# Patient Record
Sex: Female | Born: 1965
Health system: Southern US, Community
[De-identification: ages and names within clinical notes are randomized; demographics above are authoritative.]

## PROBLEM LIST (undated history)

## (undated) DIAGNOSIS — J302 Other seasonal allergic rhinitis: Secondary | ICD-10-CM

## (undated) DIAGNOSIS — T7840XA Allergy, unspecified, initial encounter: Secondary | ICD-10-CM

## (undated) DIAGNOSIS — Z9889 Other specified postprocedural states: Secondary | ICD-10-CM

## (undated) DIAGNOSIS — R112 Nausea with vomiting, unspecified: Secondary | ICD-10-CM

## (undated) HISTORY — DX: Nausea with vomiting, unspecified: R11.2

## (undated) HISTORY — PX: TOTAL ANKLE ARTHROPLASTY: SHX811

## (undated) HISTORY — PX: WISDOM TOOTH EXTRACTION: SHX21

## (undated) HISTORY — DX: Other seasonal allergic rhinitis: J30.2

## (undated) HISTORY — DX: Allergy, unspecified, initial encounter: T78.40XA

## (undated) HISTORY — DX: Nausea with vomiting, unspecified: Z98.890

## (undated) HISTORY — PX: COLONOSCOPY: SHX174

## (undated) HISTORY — PX: POLYPECTOMY: SHX149

---

## 2006-04-24 ENCOUNTER — Ambulatory Visit: Payer: Self-pay | Admitting: Family Medicine

## 2007-04-10 ENCOUNTER — Ambulatory Visit: Payer: Self-pay | Admitting: Family Medicine

## 2007-04-10 DIAGNOSIS — M25579 Pain in unspecified ankle and joints of unspecified foot: Secondary | ICD-10-CM

## 2007-05-14 ENCOUNTER — Encounter (INDEPENDENT_AMBULATORY_CARE_PROVIDER_SITE_OTHER): Payer: Self-pay | Admitting: Internal Medicine

## 2007-05-25 ENCOUNTER — Telehealth (INDEPENDENT_AMBULATORY_CARE_PROVIDER_SITE_OTHER): Payer: Self-pay | Admitting: Internal Medicine

## 2007-06-04 ENCOUNTER — Encounter (INDEPENDENT_AMBULATORY_CARE_PROVIDER_SITE_OTHER): Payer: Self-pay | Admitting: Internal Medicine

## 2007-06-10 ENCOUNTER — Ambulatory Visit: Payer: Self-pay | Admitting: Physician Assistant

## 2007-07-21 ENCOUNTER — Ambulatory Visit: Payer: Self-pay | Admitting: Family Medicine

## 2007-07-21 ENCOUNTER — Encounter (INDEPENDENT_AMBULATORY_CARE_PROVIDER_SITE_OTHER): Payer: Self-pay | Admitting: Internal Medicine

## 2007-07-23 ENCOUNTER — Encounter (INDEPENDENT_AMBULATORY_CARE_PROVIDER_SITE_OTHER): Payer: Self-pay | Admitting: Internal Medicine

## 2007-07-23 LAB — CONVERTED CEMR LAB
ALT: 16 units/L (ref 0–35)
AST: 21 units/L (ref 0–37)
Alkaline Phosphatase: 66 units/L (ref 39–117)
BUN: 7 mg/dL (ref 6–23)
Basophils Relative: 0 % (ref 0.0–1.0)
Bilirubin, Direct: 0.1 mg/dL (ref 0.0–0.3)
CO2: 29 meq/L (ref 19–32)
Calcium: 9.4 mg/dL (ref 8.4–10.5)
Chloride: 103 meq/L (ref 96–112)
Creatinine, Ser: 0.7 mg/dL (ref 0.4–1.2)
Eosinophils Relative: 0.9 % (ref 0.0–5.0)
Glucose, Bld: 92 mg/dL (ref 70–99)
Lymphocytes Relative: 23.5 % (ref 12.0–46.0)
Monocytes Relative: 2.4 % — ABNORMAL LOW (ref 3.0–11.0)
Neutro Abs: 5.1 10*3/uL (ref 1.4–7.7)
Platelets: 257 10*3/uL (ref 150–400)
RDW: 11.6 % (ref 11.5–14.6)
Total Bilirubin: 0.8 mg/dL (ref 0.3–1.2)
Total Protein: 7.7 g/dL (ref 6.0–8.3)
WBC: 7.1 10*3/uL (ref 4.5–10.5)

## 2007-07-31 ENCOUNTER — Ambulatory Visit: Payer: Self-pay | Admitting: Podiatry

## 2008-05-13 ENCOUNTER — Ambulatory Visit: Payer: Self-pay | Admitting: Family Medicine

## 2008-05-13 DIAGNOSIS — J309 Allergic rhinitis, unspecified: Secondary | ICD-10-CM

## 2008-12-21 ENCOUNTER — Telehealth: Payer: Self-pay | Admitting: Family Medicine

## 2009-05-08 ENCOUNTER — Ambulatory Visit: Payer: Self-pay | Admitting: Family Medicine

## 2009-06-15 ENCOUNTER — Ambulatory Visit: Payer: Self-pay | Admitting: Family Medicine

## 2010-10-11 ENCOUNTER — Other Ambulatory Visit: Payer: Self-pay | Admitting: Family Medicine

## 2010-10-11 ENCOUNTER — Ambulatory Visit (INDEPENDENT_AMBULATORY_CARE_PROVIDER_SITE_OTHER): Payer: BC Managed Care – PPO | Admitting: Family Medicine

## 2010-10-11 ENCOUNTER — Encounter: Payer: Self-pay | Admitting: Family Medicine

## 2010-10-11 DIAGNOSIS — M25569 Pain in unspecified knee: Secondary | ICD-10-CM | POA: Insufficient documentation

## 2010-10-11 DIAGNOSIS — Z136 Encounter for screening for cardiovascular disorders: Secondary | ICD-10-CM

## 2010-10-11 DIAGNOSIS — Z0000 Encounter for general adult medical examination without abnormal findings: Secondary | ICD-10-CM

## 2010-10-12 LAB — BASIC METABOLIC PANEL
BUN: 15 mg/dL (ref 6–23)
CO2: 30 mEq/L (ref 19–32)
Calcium: 9.3 mg/dL (ref 8.4–10.5)
Chloride: 101 mEq/L (ref 96–112)
Creatinine, Ser: 1 mg/dL (ref 0.4–1.2)
GFR: 60.44 mL/min (ref >60.00)
Glucose, Bld: 89 mg/dL (ref 70–99)
Potassium: 4.9 mEq/L (ref 3.5–5.1)
Sodium: 138 mEq/L (ref 135–145)

## 2010-10-12 LAB — LIPID PANEL
Cholesterol: 188 mg/dL (ref 0–200)
HDL: 85.3 mg/dL (ref >39.00)
LDL Cholesterol: 82 mg/dL (ref 0–99)
Total CHOL/HDL Ratio: 2
Triglycerides: 106 mg/dL (ref 0.0–149.0)
VLDL: 21.2 mg/dL (ref 0.0–40.0)

## 2010-10-18 NOTE — Assessment & Plan Note (Signed)
Summary: transfer from [facility], renew meds/alc   Vital Signs:  Patient profile:   45 year old female Height:      63.5 inches Weight:      177.50 pounds BMI:     31.06 Temp:     98.7 degrees F oral Pulse rate:   68 / minute Pulse rhythm:   regular BP sitting:   110 / 72  (left arm) Cuff size:   large  Vitals Entered By: Linde Gillis CMA [facility]) (October 11, 2010 2:47 PM) CC: transfer from [facility]   History of Present Illness: 45 yo healthy female here to establish care from [facility].  has had some intermittent right knee pain since her ankle surgery in 2008. pain typically starts at end of day or after working in yard. not in front of knee, some aching behind the knee. today, not having any symptoms.  allergic rhinitis- stable on Clarinex and flonase.  Uses proventil only when she has a bad cold.  no h/o asthma.  well woman- due for pap smear (goes to [facility]).  No h/o abnormal pap smears has never had a mammogram. due for FLP, BMET as well.  Current Medications (verified): 1)  Clarinex-D 24 Hour 5-240 Mg Xr24h-Tab (Desloratadine-Pseudoephedrine) .Marland Kitchen.. 1 Once Daily For Allergic Rhinitis As Needed 2)  Flonase 50 Mcg/act Susp (Fluticasone Propionate) .... 2 Sprays in Each Notril Daily As Needed. 3)  Proventil Hfa 108 (90 Base) Mcg/act Aers (Albuterol Sulfate) .... 2 Puffs Every 4 Hrs As Needed Chest Tightness or Wheezing  Allergies (verified): No Known Drug Allergies  Past History:  Past Surgical History: Last updated: 07/21/2007 wisdom teeth without anesthesia  Family History: Last updated: 07/21/2007 Father: living--HBP, elevated lipids Mother: living--well Siblings: 2 br--L&W               4 sis--1 with cardiovascular syndrome(genetic), depression                         3 --L&W  DM-MGM, nephew MI-0 CVA- 0 Prostate Cancer-0 Breast Cancer-0 Ovarian Cancer-0 Uterine Cancer-0 Colon Cancer-0 Drug/ ETOH Abuse-0 Depression- 0  Social History: Last  updated: 07/21/2007 Marital Status: single Children: 0 Occupation:   Risk Factors: Smoking Status: never (07/21/2007)  Review of Systems      See HPI General:  Denies malaise. Eyes:  Denies blurring. CV:  Denies chest pain or discomfort. Resp:  Denies shortness of breath. GI:  Denies abdominal pain and change in bowel habits. GU:  Denies abnormal vaginal bleeding and dysuria. Derm:  Denies rash. Neuro:  Denies headaches. Psych:  Denies anxiety and depression. Endo:  Denies cold intolerance and heat intolerance.  Physical Exam  General:  alert, well-developed, well-nourished, and well-hydrated.   Head:  normocephalic and atraumatic.   Eyes:  pupils equal, pupils round, and no injection.   Ears:  R ear normal and L ear normal.   Nose:  External nasal examination shows no deformity or inflammation. Nasal mucosa are pink and moist without lesions or exudates. Mouth:  Oral mucosa and oropharynx without lesions or exudates.  Teeth in good repair. Lungs:  normal respiratory effort, no intercostal retractions, no accessory muscle use, normal breath sounds, no crackles, and no wheezes.   Heart:  normal rate, regular rhythm, and no murmur.   Abdomen:  Bowel sounds positive,abdomen soft and non-tender without masses, organomegaly or hernias noted. Msk:  right ankle- well healed scars Extremities:  no edema Neurologic:  alert &  oriented X3 and gait normal.   Skin:  Intact without suspicious lesions or rashes Psych:  Cognition and judgment appear intact. Alert and cooperative with normal attention span and concentration. No apparent delusions, illusions, hallucinations   Knee Exam  Knee Exam:    Right:    Inspection:  Normal    Palpation:  Normal    Stability:  stable    Tenderness:  no    Swelling:  no    Erythema:  no    no palpable baker's cyst, does have some vericose veins just under her post knee    Range of Motion:       Flexion-Active: full       Extension-Active:  full       Flexion-Passive: full       Extension-Passive: full    Left:    Inspection:  Normal    Palpation:  Normal    Stability:  stable    Tenderness:  no    Swelling:  no    Erythema:  no    Range of Motion:       Flexion-Active: full       Extension-Active: full       Flexion-Passive: full       Extension-Passive: full   Impression & Recommendations:  Problem # 1:  ALLERGIC RHINITIS (ICD-477.9) Assessment Unchanged Refilled her meds.   Her updated medication list for this problem includes:    Flonase 50 Mcg/act Susp (Fluticasone propionate) .Marland Kitchen... 2 sprays in each notril daily as needed.  Problem # 2:  PREVENTIVE HEALTH CARE (ICD-V70.0) Reviewed preventive care protocols, scheduled due services, and updated immunizations Discussed nutrition, exercise, diet, and healthy lifestyle.  BMET, Lipid panel. Set up mammogram today. Advised to make appt with her OBGYN.   Orders: TLB-BMP (Basic Metabolic Panel-BMET) (80048-METABOL)  Problem # 3:  KNEE PAIN, RIGHT, ACUTE (EAV-409.81) Assessment: New physical exam unremarkable.  ?baker's cyst.  Continue to monitor, will get imaging if persists.    Complete Medication List: 1)  Clarinex-d 24 Hour 5-240 Mg Xr24h-tab (Desloratadine-pseudoephedrine) .Marland Kitchen.. 1 once daily for allergic rhinitis as needed 2)  Flonase 50 Mcg/act Susp (Fluticasone propionate) .... 2 sprays in each notril daily as needed. 3)  Proventil Hfa 108 (90 Base) Mcg/act Aers (Albuterol sulfate) .... 2 puffs every 4 hrs as needed chest tightness or wheezing  Other Orders: Radiology Referral (Radiology) Venipuncture (19147) TLB-Lipid Panel (80061-LIPID)  Patient Instructions: 1)  Please stop by to see Shirlee Limerick on your way out. Prescriptions: PROVENTIL HFA 108 (90 BASE) MCG/ACT AERS (ALBUTEROL SULFATE) 2 puffs every 4 hrs as needed chest tightness or wheezing  #3 x 3   Entered and Authorized by:   Ruthe Mannan MD   Signed by:   Ruthe Mannan MD on 10/11/2010   Method  used:   Faxed to ...       [facility])             , [place]         Ph:        Fax: [phone]   RxID:   [id number] CLARINEX-D 24 HOUR 5-240 MG XR24H-TAB (DESLORATADINE-PSEUDOEPHEDRINE) 1 once daily for allergic rhinitis as needed  #90 x 3   Entered and Authorized by:   Ruthe Mannan MD   Signed by:   Ruthe Mannan MD on 10/11/2010   Method used:   Faxed to ...       [facility])             ,  [place]         Ph:        Fax: [phone]   RxID:   [id number]    Orders Added: 1)  Radiology Referral [Radiology] 2)  Venipuncture [69629] 3)  TLB-Lipid Panel [80061-LIPID] 4)  TLB-BMP (Basic Metabolic Panel-BMET) [80048-METABOL] 5)  Est. Patient 40-64 years [52841]    Current Allergies (reviewed today): No known allergies

## 2010-11-01 ENCOUNTER — Ambulatory Visit: Payer: Self-pay | Admitting: Family Medicine

## 2010-11-02 ENCOUNTER — Encounter: Payer: Self-pay | Admitting: Family Medicine

## 2010-11-07 ENCOUNTER — Encounter: Payer: Self-pay | Admitting: Family Medicine

## 2010-12-28 NOTE — Assessment & Plan Note (Signed)
[facility]                             [place] OFFICE NOTE   LATEKA, RADY                         MRN:          [id number]  DATE:04/24/2006                            DOB:          05/09/1975    Kathy Byrd is a 45 year old white female who comes to establish with  practice due to left neck pain times 3-1/2 to 4 months.  She indicates that  it is worse at work and worsens as the week progresses.  She has taken  Tylenol with no improvement.  About the same period at the onset of pain,  there was rearrangement of her desk.  This, however, has been changed back  to the original positioning with no improvement in her neck pain.  She also  indicates that there is a lot of stress at work.   She has had no recent medical care including no gynecological checks.   CURRENT MEDICATIONS:  None.   ALLERGIES:  None.   PAST MEDICAL HISTORY:  Basically indicates that she has been very well with  no acute or chronic illnesses with the exception of chickenpox as a child.  Her only surgeries have been extraction of her wisdom teeth without  anesthesia.  She has never been hospitalized.   SOCIAL HISTORY:  She is single, has no children.  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  She is in no  exercise program.  Never smoked.  Does not use alcohol or street drugs.   REVIEW OF SYSTEMS:  Basically negative including HEENT, CARDIAC, GI, GU.  RESPIRATORY:  She indicates that she had wheezing about 15 to 20 years ago  when she was in [place] but this has not returned after her moving back to  [place].  She has 1 tattoo in her right groin.  GYNECOLOGIC:  Her  last Pap smear was about 4 to 5 years ago which was negative.  She has never  had a mammogram.  MUSCULOSKELETAL:  Indicates she had a fractured right  ankle, playing softball, which was casted some years ago.  She has no  sequelae.  She also has a popping  in her right hip but no pain.  She does  have allergic rhinitis and uses Clarinex-D 24-hours which works nicely.   FAMILY HISTORY:  Father is living at age 71 with hypertension,  hyperlipidemia.  Mother is living at age 69 with no known medical problems.  Her maternal grandmother died of diabetes complication.  Her age is not  known.  Her maternal grandfather died at age 1, reason is not known.  She  has no knowledge of why her paternal grandparents died.  She has 2 brothers  living and well.  Four sisters, all living with 1 having a cardiovascular  syndrome, which is genetic, and depression.  Otherwise, her sisters are  well.   With questions regarding extended family, I find no further cardiovascular  problems.  She does have diabetes as mentioned in the maternal grandmother  and a nephew.  There is  no cancer in the family.  No further depression,  drug, or alcohol abuse.   IMMUNIZATION RECORD:  Her last tetanus was greater than 10 years ago.  She  has not had the hepatitic B or pneumonia vaccine.   PHYSICAL EXAMINATION:  VITAL SIGNS:  Blood pressure 108/68.  Temperature  98.2.  Pulse is 72.  Weight is 155.  Height 64 inches.  GENERAL:  Well-developed, well-nourished white female in no acute distress.  CHEST:  Clear throughout.  No rales, rhonchi, or wheezes.  HEART:  Rate and rhythm regular without murmurs, gallops, or rubs.  No  carotid bruits.  No pretibial edema.  MUSCULOSKELETAL:  She has a full range of motion of her neck and left  shoulder.  There is pain with rotation of her head to the right, evidencing  itself in her left upper shoulder onto her neck.  There is minimal pain with  flexion of her upper arm at full flexion.  With palpation, she has muscle  spasms in her trapezius from the lateral lower neck to the upper shoulder on  the left; minimal spasms on the right.  SKIN:  Without obvious lesions in the exposed area.  PSYCHIATRIC:  Oriented times 3, verbalizes easily,  and is quite pleasant.   ASSESSMENT:  Muscle spasms in the neck and upper left shoulder causing pain.   PLAN:  1. A trial of Skelaxin 800 mg, 1/2 to 1 q.i.d. with samples and      prescription given.  Have encouraged massage, heat, and, during the      week, gentle stretches several times during the day.  We will send her      to PT if there is no improvement in the above in the next 2 to 3 weeks.      I have also encouraged exercise as a means of reducing the stress.  2. In need of health maintenance update.  She needs a Pap and a DT.  These      will be scheduled.                                   Billie D. Jillyn Hidden, FNP                                Arta Silence, MD   BDB/MedQ  DD:  04/25/2006  DT:  04/25/2006  Job #:  [id number]

## 2011-05-28 ENCOUNTER — Other Ambulatory Visit: Payer: Self-pay | Admitting: *Deleted

## 2011-05-28 NOTE — Telephone Encounter (Signed)
Received fax from [facility] for clarification on Clarinex Rx, form in your IN box.

## 2011-05-29 MED ORDER — DESLORATADINE-PSEUDOEPHED ER 5-240 MG PO TB24: 1.0000 | ORAL_TABLET | Freq: Every day | ORAL | Status: DC | PRN

## 2011-05-29 NOTE — Telephone Encounter (Signed)
Form faxed to [facility].

## 2011-05-30 ENCOUNTER — Telehealth: Payer: Self-pay | Admitting: *Deleted

## 2011-05-30 NOTE — Telephone Encounter (Signed)
Clarification is needed on clarinex script.  [facility] is saying that clarinex D 24 hr tabs are unavailable from the manufacturer.  Fax is on your desk.

## 2011-05-31 NOTE — Telephone Encounter (Signed)
In my box

## 2011-06-04 NOTE — Telephone Encounter (Signed)
Form completed and faxed to [facility] on 05/29/2011.

## 2011-10-11 ENCOUNTER — Encounter: Payer: Self-pay | Admitting: Family Medicine

## 2011-10-14 ENCOUNTER — Ambulatory Visit (INDEPENDENT_AMBULATORY_CARE_PROVIDER_SITE_OTHER): Payer: BC Managed Care – PPO | Admitting: Family Medicine

## 2011-10-14 VITALS — BP 116/78 | HR 80 | Temp 97.9°F | Ht 64.0 in | Wt 176.0 lb

## 2011-10-14 DIAGNOSIS — Z0289 Encounter for other administrative examinations: Secondary | ICD-10-CM

## 2011-10-14 DIAGNOSIS — Z136 Encounter for screening for cardiovascular disorders: Secondary | ICD-10-CM

## 2011-10-14 DIAGNOSIS — Z026 Encounter for examination for insurance purposes: Secondary | ICD-10-CM

## 2011-10-14 LAB — BASIC METABOLIC PANEL
Calcium: 9.4 mg/dL (ref 8.4–10.5)
GFR: 85.14 mL/min (ref >60.00)
Glucose, Bld: 90 mg/dL (ref 70–99)
Sodium: 140 mEq/L (ref 135–145)

## 2011-10-14 LAB — LDL CHOLESTEROL, DIRECT: Direct LDL: 111.8 mg/dL

## 2011-10-14 LAB — LIPID PANEL: HDL: 86.6 mg/dL (ref >39.00)

## 2011-10-14 NOTE — Progress Notes (Signed)
46 yo here to have wellness form filled out.  Does not require exam- BP, weight, waist circumference, and fasting labs: BMET, lipid panel, and nicotine required.  Pt is a non smoker.   Patient Active Problem List  Diagnoses  . ALLERGIC RHINITIS  . ANKLE PAIN, RIGHT  . KNEE PAIN, RIGHT, ACUTE  . Encounter for insurance examination   No past medical history on file. Past Surgical History  Procedure Date  . Wisdom tooth extraction     without anesthesia   History  Substance Use Topics  . Smoking status: Not on file  . Smokeless tobacco: Not on file  . Alcohol Use:    Family History  Problem Relation Age of Onset  . Hyperlipidemia Father   . Hypertension Father   . Diabetes Maternal Grandmother   . Depression Sister   . Heart disease Sister     cardiovascular syndrome, genetic   Allergies not on file Current Outpatient Prescriptions on File Prior to Visit  Medication Sig Dispense Refill  . albuterol (PROVENTIL HFA;VENTOLIN HFA) 108 (90 BASE) MCG/ACT inhaler 2 puffs every 4 hours as needed for chest tightness or wheezing      . desloratadine-pseudoephedrine (CLARINEX-D 24-HOUR) 5-240 MG per 24 hr tablet Take 1 tablet by mouth daily as needed.  90 tablet  3  . fluticasone (FLONASE) 50 MCG/ACT nasal spray Place 2 sprays into the nose daily.       The PMH, PSH, Social History, Family History, Medications, and allergies have been reviewed in [facility], and have been updated if relevant.  1. Encounter for insurance examination  Basic Metabolic Panel (BMET), Nicotine/cotinine metabolites Lipid Panel   Forms filled out, labs ordered. Once results received, will fax form.

## 2011-10-15 LAB — NICOTINE/COTININE METABOLITES: Cotinine: <10 ng/mL

## 2011-12-24 ENCOUNTER — Ambulatory Visit: Payer: Self-pay | Admitting: Family Medicine

## 2011-12-25 ENCOUNTER — Encounter: Payer: Self-pay | Admitting: Family Medicine

## 2011-12-25 ENCOUNTER — Encounter: Payer: Self-pay | Admitting: *Deleted

## 2011-12-25 LAB — HM MAMMOGRAPHY: HM Mammogram: NORMAL

## 2012-02-17 ENCOUNTER — Ambulatory Visit: Payer: Self-pay | Admitting: Orthopedic Surgery

## 2012-04-08 ENCOUNTER — Ambulatory Visit (INDEPENDENT_AMBULATORY_CARE_PROVIDER_SITE_OTHER): Payer: BC Managed Care – PPO | Admitting: Family Medicine

## 2012-04-08 ENCOUNTER — Encounter: Payer: Self-pay | Admitting: Family Medicine

## 2012-04-08 VITALS — BP 124/82 | HR 68 | Temp 97.9°F | Wt 188.0 lb

## 2012-04-08 DIAGNOSIS — R635 Abnormal weight gain: Secondary | ICD-10-CM

## 2012-04-08 LAB — COMPREHENSIVE METABOLIC PANEL
AST: 27 U/L (ref 0–37)
Albumin: 3.9 g/dL (ref 3.5–5.2)
Alkaline Phosphatase: 75 U/L (ref 39–117)
Potassium: 4.3 mEq/L (ref 3.5–5.1)
Sodium: 138 mEq/L (ref 135–145)
Total Protein: 7.2 g/dL (ref 6.0–8.3)

## 2012-04-08 LAB — CBC WITH DIFFERENTIAL/PLATELET
Eosinophils Absolute: 0.1 10*3/uL (ref 0.0–0.7)
Eosinophils Relative: 2.1 % (ref 0.0–5.0)
MCHC: 32.3 g/dL (ref 30.0–36.0)
MCV: 90 fl (ref 78.0–100.0)
Monocytes Absolute: 0.4 10*3/uL (ref 0.1–1.0)
Neutrophils Relative %: 64.1 % (ref 43.0–77.0)
Platelets: 252 10*3/uL (ref 150.0–400.0)
WBC: 5.2 10*3/uL (ref 4.5–10.5)

## 2012-04-08 MED ORDER — PHENTERMINE HCL 15 MG PO CAPS: 15.0000 mg | ORAL_CAPSULE | ORAL | Status: DC

## 2012-04-08 NOTE — Patient Instructions (Addendum)
Great to see you. Please wait for [place] to call you with your labs before you start phentermine. Please come see me in one month.  (after you start phentermine).  Phentermine tablets or capsules What is this medicine? PHENTERMINE (FEN ter meen) decreases your appetite. It is used with a reduced calorie diet and exercise to help you lose weight. This medicine may be used for other purposes; ask your health care provider or pharmacist if you have questions. What should I tell my health care provider before I take this medicine? They need to know if you have any of these conditions: -agitation -glaucoma -heart disease -high blood pressure -history of substance abuse -lung disease called Primary Pulmonary Hypertension (PPH) -taken an MAOI like Carbex, Eldepryl, Marplan, Nardil, or Parnate in last 14 days -thyroid disease -an unusual or allergic reaction to phentermine, other medicines, foods, dyes, or preservatives -pregnant or trying to get pregnant -breast-feeding How should I use this medicine? Take this medicine by mouth with a glass of water. Follow the directions on the prescription label. This medicine is usually taken 30 minutes before or 1 to 2 hours after breakfast. Avoid taking this medicine in the evening. It may interfere with sleep. Take your doses at regular intervals. Do not take your medicine more often than directed. Talk to your pediatrician regarding the use of this medicine in children. Special care may be needed. Overdosage: If you think you have taken too much of this medicine contact a poison control center or emergency room at once. NOTE: This medicine is only for you. Do not share this medicine with others. What if I miss a dose? If you miss a dose, take it as soon as you can. If it is almost time for your next dose, take only that dose. Do not take double or extra doses. What may interact with this medicine? Do not take this medicine with any of the following  medications: -duloxetine -MAOIs like Carbex, Eldepryl, Marplan, Nardil, and Parnate -medicines for colds or breathing difficulties like pseudoephedrine or phenylephrine -procarbazine -sibutramine -SSRIs like citalopram, escitalopram, fluoxetine, fluvoxamine, paroxetine, and sertraline -stimulants like dexmethylphenidate, methylphenidate or modafinil -venlafaxine This medicine may also interact with the following medications: -medicines for diabetes This list may not describe all possible interactions. Give your health care provider a list of all the medicines, herbs, non-prescription drugs, or dietary supplements you use. Also tell them if you smoke, drink alcohol, or use illegal drugs. Some items may interact with your medicine. What should I watch for while using this medicine? Notify your physician immediately if you become short of breath while doing your normal activities. Do not take this medicine within 6 hours of bedtime. It can keep you from getting to sleep. Avoid drinks that contain caffeine and try to stick to a regular bedtime every night. This medicine was intended to be used in addition to a healthy diet and exercise. The best results are achieved this way. This medicine is only indicated for short-term use. Eventually your weight loss may level out. At that point, the drug will only help you maintain your new weight. Do not increase or in any way change your dose without consulting your doctor. You may get drowsy or dizzy. Do not drive, use machinery, or do anything that needs mental alertness until you know how this medicine affects you. Do not stand or sit up quickly, especially if you are an older patient. This reduces the risk of dizzy or fainting spells. Alcohol may increase  dizziness and drowsiness. Avoid alcoholic drinks. What side effects may I notice from receiving this medicine? Side effects that you should report to your doctor or health care professional as soon as  possible: -chest pain, palpitations -depression or severe changes in mood -increased blood pressure -irritability -nervousness or restlessness -severe dizziness -shortness of breath -problems urinating -unusual swelling of the legs -vomiting Side effects that usually do not require medical attention (report to your doctor or health care professional if they continue or are bothersome): -blurred vision or other eye problems -changes in sexual ability or desire -constipation or diarrhea -difficulty sleeping -dry mouth or unpleasant taste -headache -nausea This list may not describe all possible side effects. Call your doctor for medical advice about side effects. You may report side effects to FDA at 1-800-FDA-1088. Where should I keep my medicine? Keep out of the reach of children. This medicine can be abused. Keep your medicine in a safe place to protect it from theft. Do not share this medicine with anyone. Selling or giving away this medicine is dangerous and against the law. Store at room temperature between 20 and 25 degrees C (68 and 77 degrees F). Keep container tightly closed. Throw away any unused medicine after the expiration date. NOTE: This sheet is a summary. It may not cover all possible information. If you have questions about this medicine, talk to your doctor, pharmacist, or health care provider.  2012, [employer]vier/[employer] Standard. (09/12/2010 11:02:44 AM)

## 2012-04-08 NOTE — Progress Notes (Signed)
  Subjective:    Patient ID: Kathy Byrd, female    DOB: [birth year], 46 y.o.   MRN: [id number]  HPI  46 yo here for weight gain.  Had ankle surgery in February so she has been more inactive.  She has gained at least 12 pounds since that time. Now she has to have ankle replacement surgery in October. Wants to lose weight prior to surgery.  Denies any changes in bowels, skin hair. No palpitations.  No CP or SOB.  Patient Active Problem List  Diagnosis  . ALLERGIC RHINITIS  . ANKLE PAIN, RIGHT  . KNEE PAIN, RIGHT, ACUTE  . Encounter for insurance examination  . Weight gain   No past medical history on file. Past Surgical History  Procedure Date  . Wisdom tooth extraction     without anesthesia   History  Substance Use Topics  . Smoking status: Never Smoker   . Smokeless tobacco: Not on file  . Alcohol Use: Not on file   Family History  Problem Relation Age of Onset  . Hyperlipidemia Father   . Hypertension Father   . Diabetes Maternal Grandmother   . Depression Sister   . Heart disease Sister     cardiovascular syndrome, genetic   No Known Allergies Current Outpatient Prescriptions on File Prior to Visit  Medication Sig Dispense Refill  . albuterol (PROVENTIL HFA;VENTOLIN HFA) 108 (90 BASE) MCG/ACT inhaler 2 puffs every 4 hours as needed for chest tightness or wheezing      . desloratadine-pseudoephedrine (CLARINEX-D 24-HOUR) 5-240 MG per 24 hr tablet Take 1 tablet by mouth daily as needed.  90 tablet  3  . fluticasone (FLONASE) 50 MCG/ACT nasal spray Place 2 sprays into the nose daily.      . phentermine 15 MG capsule Take 1 capsule (15 mg total) by mouth every morning.  30 capsule  0   The PMH, PSH, Social History, Family History, Medications, and allergies have been reviewed in [facility], and have been updated if relevant.   Review of Systems    See HPI Objective:   Physical Exam  BP 124/82  Pulse 68  Temp 97.9 F (36.6 C)  Wt 188 lb (85.276 kg) Wt  Readings from Last 3 Encounters:  04/08/12 188 lb (85.276 kg)  10/14/11 176 lb (79.833 kg)  10/11/10 177 lb 8 oz (80.513 kg)   Gen:  Alert, pleasant NAD Psych:  Good eye contact, not anxious or depressed appearing.      Assessment & Plan:   1. Weight gain  TSH, CBC with Differential, Comprehensive metabolic panel   >40 min spent with face to face with patient, >50% counseling and/or coordinating care. Will check labs today to rule out other possible contributing factors. If normal, will start phentermine-risks including HTN, pulmonary HTN and stroke. The patient indicates understanding of these issues and agrees with the plan.

## 2012-05-05 ENCOUNTER — Encounter: Payer: Self-pay | Admitting: Family Medicine

## 2012-05-05 ENCOUNTER — Ambulatory Visit (INDEPENDENT_AMBULATORY_CARE_PROVIDER_SITE_OTHER): Payer: BC Managed Care – PPO | Admitting: Family Medicine

## 2012-05-05 VITALS — BP 118/80 | HR 76 | Temp 98.2°F | Wt 185.0 lb

## 2012-05-05 DIAGNOSIS — R635 Abnormal weight gain: Secondary | ICD-10-CM

## 2012-05-05 MED ORDER — PHENTERMINE HCL 15 MG PO CAPS: ORAL_CAPSULE | ORAL | Status: DC

## 2012-05-05 NOTE — Patient Instructions (Addendum)
Great to see you. You are doing great! We can increase your phentermine to 30 mg daily.

## 2012-05-05 NOTE — Progress Notes (Signed)
  Subjective:    Patient ID: Kathy Byrd, female    DOB: Feb 28, 1966, 46 y.o.   MRN: [id number]  HPI  46 yo here for one month follow up.  Had ankle surgery in February so she has been more inactive.  She has gained at least 12 pounds since that time. Now she has to have ankle replacement surgery in October. She wanted to lose weight prior to surgery.  Labs were unremarkable, no h/o HTN.   Counseled on risks of phentermine, started 15 mg last month and here for follow up.  Has lost 3 pounds since last month.  Wt Readings from Last 3 Encounters:  05/05/12 185 lb (83.915 kg)  04/08/12 188 lb (85.276 kg)  10/14/11 176 lb (79.833 kg)    Lab Results  Component Value Date   WBC 5.2 04/08/2012   HGB 12.8 04/08/2012   HCT 39.7 04/08/2012   MCV 90.0 04/08/2012   PLT 252.0 04/08/2012   Lab Results  Component Value Date   TSH 1.10 04/08/2012     Tolerated phentermine well.  No CP or SOB. Sleeping ok.  Patient Active Problem List  Diagnosis  . ALLERGIC RHINITIS  . ANKLE PAIN, RIGHT  . KNEE PAIN, RIGHT, ACUTE  . Encounter for insurance examination  . Weight gain   No past medical history on file. Past Surgical History  Procedure Date  . Wisdom tooth extraction     without anesthesia   History  Substance Use Topics  . Smoking status: Never Smoker   . Smokeless tobacco: Not on file  . Alcohol Use: Not on file   Family History  Problem Relation Age of Onset  . Hyperlipidemia Father   . Hypertension Father   . Diabetes Maternal Grandmother   . Depression Sister   . Heart disease Sister     cardiovascular syndrome, genetic   No Known Allergies Current Outpatient Prescriptions on File Prior to Visit  Medication Sig Dispense Refill  . albuterol (PROVENTIL HFA;VENTOLIN HFA) 108 (90 BASE) MCG/ACT inhaler 2 puffs every 4 hours as needed for chest tightness or wheezing      . desloratadine-pseudoephedrine (CLARINEX-D 24-HOUR) 5-240 MG per 24 hr tablet Take 1 tablet by mouth  daily as needed.  90 tablet  3  . fluticasone (FLONASE) 50 MCG/ACT nasal spray Place 2 sprays into the nose daily.      . phentermine 15 MG capsule Take 1 capsule (15 mg total) by mouth every morning.  30 capsule  0   The PMH, PSH, Social History, Family History, Medications, and allergies have been reviewed in [facility], and have been updated if relevant.   Review of Systems    See HPI Bowels ok Objective:   Physical Exam BP 118/80  Pulse 76  Temp 98.2 F (36.8 C)  Wt 185 lb (83.915 kg) Wt Readings from Last 3 Encounters:  05/05/12 185 lb (83.915 kg)  04/08/12 188 lb (85.276 kg)  10/14/11 176 lb (79.833 kg)    Gen:  Alert, pleasant NAD Psych:  Good eye contact, not anxious or depressed appearing.      Assessment & Plan:   1. Weight gain    >15 min spent with face to face with patient, >50% counseling and/or coordinating care. Tolerating phentermine well. Increase dose to 30 mg daily. Follow up in 1 month. The patient indicates understanding of these issues and agrees with the plan.

## 2012-06-02 ENCOUNTER — Encounter: Payer: Self-pay | Admitting: Family Medicine

## 2012-06-02 ENCOUNTER — Ambulatory Visit (INDEPENDENT_AMBULATORY_CARE_PROVIDER_SITE_OTHER): Payer: BC Managed Care – PPO | Admitting: Family Medicine

## 2012-06-02 VITALS — BP 118/80 | HR 76 | Temp 98.1°F | Wt 185.0 lb

## 2012-06-02 DIAGNOSIS — R635 Abnormal weight gain: Secondary | ICD-10-CM

## 2012-06-02 MED ORDER — DESLORATADINE-PSEUDOEPHED ER 5-240 MG PO TB24: 1.0000 | ORAL_TABLET | Freq: Every day | ORAL | Status: DC | PRN

## 2012-06-02 MED ORDER — PHENTERMINE HCL 15 MG PO CAPS: ORAL_CAPSULE | ORAL | Status: DC

## 2012-06-02 NOTE — Patient Instructions (Addendum)
Good luck with your surgery.  Call me in one month with an update.

## 2012-06-02 NOTE — Progress Notes (Signed)
  Subjective:    Patient ID: Kathy Byrd, female    DOB: 01-Jan-1966, 46 y.o.   MRN: [id number]  HPI  46 yo here for one month follow up.  Had ankle surgery in February so she has been more inactive and had gained weight.  Now she has to have ankle replacement surgery this Friday.  She wanted to lose weight prior to surgery.  Labs were unremarkable, no h/o HTN.   Counseled on risks of phentermine and started it two months ago.  She is here for follow up without complaints.   Has lost 3 pounds since she started taking it.  Increased dose to 30 mg last month.  Did not lose weight this month but was not taking it regularly.  She will be home now for her surgery so she thinks she will remember to take second dose.  Wt Readings from Last 3 Encounters:  06/02/12 185 lb (83.915 kg)  05/05/12 185 lb (83.915 kg)  04/08/12 188 lb (85.276 kg)      Tolerated phentermine well.  No CP or SOB. Sleeping ok.  Patient Active Problem List  Diagnosis  . ALLERGIC RHINITIS  . ANKLE PAIN, RIGHT  . KNEE PAIN, RIGHT, ACUTE  . Encounter for insurance examination  . Weight gain   No past medical history on file. Past Surgical History  Procedure Date  . Wisdom tooth extraction     without anesthesia   History  Substance Use Topics  . Smoking status: Never Smoker   . Smokeless tobacco: Not on file  . Alcohol Use: Not on file   Family History  Problem Relation Age of Onset  . Hyperlipidemia Father   . Hypertension Father   . Diabetes Maternal Grandmother   . Depression Sister   . Heart disease Sister     cardiovascular syndrome, genetic   No Known Allergies Current Outpatient Prescriptions on File Prior to Visit  Medication Sig Dispense Refill  . albuterol (PROVENTIL HFA;VENTOLIN HFA) 108 (90 BASE) MCG/ACT inhaler 2 puffs every 4 hours as needed for chest tightness or wheezing      . desloratadine-pseudoephedrine (CLARINEX-D 24-HOUR) 5-240 MG per 24 hr tablet Take 1 tablet by mouth  daily as needed.  90 tablet  3  . fluticasone (FLONASE) 50 MCG/ACT nasal spray Place 2 sprays into the nose daily.      . phentermine 15 MG capsule 1 tablet twice daily (both tablets prior to 11 am).  60 capsule  0   The PMH, PSH, Social History, Family History, Medications, and allergies have been reviewed in [facility], and have been updated if relevant.   Review of Systems    See HPI Bowels ok Objective:   Physical Exam BP 118/80  Pulse 76  Temp 98.1 F (36.7 C)  Wt 185 lb (83.915 kg)  Wt Readings from Last 3 Encounters:  05/05/12 185 lb (83.915 kg)  04/08/12 188 lb (85.276 kg)  10/14/11 176 lb (79.833 kg)    Gen:  Alert, pleasant NAD CVS: RRR Psych:  Good eye contact, not anxious or depressed appearing.      Assessment & Plan:   1. Weight gain    >15 min spent with face to face with patient, >50% counseling and/or coordinating care. Tolerating phentermine well. Continue current dose- advised not take in the immediate post operative period. Follow up in 1 month. The patient indicates understanding of these issues and agrees with the plan.

## 2012-06-03 ENCOUNTER — Telehealth: Payer: Self-pay

## 2012-06-03 NOTE — Telephone Encounter (Signed)
[facility]ed clarification request for Clarinex D 24 hr tab. clarinex d 24 hr tab has been discontinued. Possible alternatives are Desloratadine 5 mg or levocetirizine 5 mg or doctors choice. See faxed clarification sheet in Dr Elmer Sow in box.Please advise.

## 2012-06-04 NOTE — Telephone Encounter (Signed)
Completed and in my box. 

## 2012-06-16 NOTE — Telephone Encounter (Signed)
Faxed

## 2012-09-18 ENCOUNTER — Encounter: Payer: Self-pay | Admitting: Family Medicine

## 2012-09-18 ENCOUNTER — Ambulatory Visit (INDEPENDENT_AMBULATORY_CARE_PROVIDER_SITE_OTHER): Payer: BC Managed Care – PPO | Admitting: Family Medicine

## 2012-09-18 VITALS — BP 102/78 | HR 89 | Temp 98.3°F | Wt 181.8 lb

## 2012-09-18 DIAGNOSIS — J069 Acute upper respiratory infection, unspecified: Secondary | ICD-10-CM | POA: Insufficient documentation

## 2012-09-18 MED ORDER — FLUTICASONE PROPIONATE 50 MCG/ACT NA SUSP: 2.0000 | Freq: Every day | NASAL | Status: DC

## 2012-09-18 NOTE — Progress Notes (Signed)
Sx started about 1 week ago.  No help with zycam.  She had some flonase but it was out of date.  Feels tired, occ cough, some aches.  Minimal ST.  Occ ear pain B.  No fevers; occ chills.  No NAVD.  Sick contact noted.  "I don't feel awful but I don't feel good."  Albuterol helped some with her throat sx.   Meds, vitals, and allergies reviewed.   ROS: See HPI.  Otherwise, noncontributory.  GEN: nad, alert and oriented HEENT: mucous membranes moist, tm w/o erythema, nasal exam w/o erythema, clear discharge noted,  OP with cobblestoning, sinuses not ttp NECK: supple w/o LA CV: rrr.   PULM: ctab, no inc wob EXT: no edema SKIN: no acute rash

## 2012-09-18 NOTE — Assessment & Plan Note (Signed)
Mild sx, nontoxic.  This should self resolve.  Likely viral.  Would use saba and flonase in meantime, fu prn.  Supportive tx o/w. ddx d/w pt.

## 2012-09-18 NOTE — Patient Instructions (Addendum)
Use the flonase and albuterol.  Drink plenty of fluids, take tylenol as needed, and gargle with warm salt water for your throat.  This should gradually improve.  Take care.  Let us know if you have other concerns.

## 2012-12-04 ENCOUNTER — Telehealth: Payer: Self-pay

## 2012-12-04 NOTE — Telephone Encounter (Signed)
Pt request refill claritin D; [facility]; should be available thru 05/2013.

## 2012-12-10 ENCOUNTER — Other Ambulatory Visit: Payer: Self-pay

## 2012-12-10 MED ORDER — DESLORATADINE-PSEUDOEPHED ER 5-240 MG PO TB24: 1.0000 | ORAL_TABLET | Freq: Every day | ORAL | Status: DC | PRN

## 2012-12-10 NOTE — Telephone Encounter (Signed)
Pt request refill Clarinex D 24 hr tab # [facility] x [facility].Pt is out of med and request #30 Clarinex D 24 hr tab to [facility].Please advise.

## 2012-12-11 MED ORDER — DESLORATADINE-PSEUDOEPHED ER 5-240 MG PO TB24: 1.0000 | ORAL_TABLET | Freq: Every day | ORAL | Status: DC | PRN

## 2012-12-11 NOTE — Addendum Note (Signed)
Addended by: Patience Musca on: 12/11/2012 02:08 PM   Modules accepted: Orders

## 2012-12-11 NOTE — Telephone Encounter (Signed)
Pt got notice [facility] but med at local pharmacy until mail order arrives is not at pharmacy. Advised clarinex d sent to [facility] electronically.

## 2012-12-14 ENCOUNTER — Telehealth: Payer: Self-pay | Admitting: *Deleted

## 2012-12-14 NOTE — Telephone Encounter (Signed)
Pharmacy advised  

## 2012-12-14 NOTE — Telephone Encounter (Signed)
She can buy Claritin D behind the counter.

## 2012-12-14 NOTE — Telephone Encounter (Signed)
Received a fax from pharmacy [facility]) that clarinex d is no longer on the market. Request rx for something else.

## 2012-12-15 NOTE — Telephone Encounter (Signed)
I could send in an rx for allegra.  I am unsure if there are any other options since everything is now OTC.

## 2012-12-15 NOTE — Telephone Encounter (Addendum)
[facility] pharmacist ref # [id number]; Clarinex D has been discontinued; the allegra D has also been d/ced. Pt also called and said she had gotten Claritin OTC and that is helping a little but when pt goes outside throat begins to tighten; pt trying to stay inside. Pt request a sub for Clarinex D to [facility].Please advise.pt request call back and tomorrow is OK>

## 2012-12-16 MED ORDER — FEXOFENADINE HCL 180 MG PO TABS: 180.0000 mg | ORAL_TABLET | Freq: Every day | ORAL | Status: DC

## 2012-12-16 NOTE — Telephone Encounter (Signed)
Patient notified as instructed by telephone. Patient stated that she got some Claritin OTC and started taking it 2 days ago and it has not helped. Patient states that she would like Allegra sent to pharmacy for her to try.

## 2012-12-16 NOTE — Addendum Note (Signed)
Addended by: Dianne Dun on: 12/16/2012 12:20 PM   Modules accepted: Orders

## 2012-12-16 NOTE — Telephone Encounter (Signed)
Patient notified as instructed by telephone. Pt will ck with [facility].

## 2012-12-16 NOTE — Telephone Encounter (Signed)
Allegra rx sent

## 2012-12-16 NOTE — Telephone Encounter (Signed)
Pt left v/m requesting status of med for allergies called to [facility]. Spoke with Angelique Blonder at [facility] and Joyce Copa is ready for pick up.Left v/m for pt to call back.

## 2012-12-17 NOTE — Telephone Encounter (Signed)
[facility]odoro Grist [facility]ist [facility] called to verify that pt had gotten substitute for Clarinex D. Advised pt requested med to local pharmacy. Theodoro Grist said OK probably an OTC med.

## 2013-01-01 ENCOUNTER — Other Ambulatory Visit: Payer: Self-pay | Admitting: *Deleted

## 2013-01-01 NOTE — Telephone Encounter (Signed)
Is this ok to refill?  

## 2013-01-05 MED ORDER — FLUTICASONE PROPIONATE 50 MCG/ACT NA SUSP: 2.0000 | Freq: Every day | NASAL | Status: DC

## 2013-01-13 ENCOUNTER — Encounter: Payer: Self-pay | Admitting: *Deleted

## 2013-01-13 ENCOUNTER — Ambulatory Visit: Payer: Self-pay | Admitting: Family Medicine

## 2013-01-13 ENCOUNTER — Encounter: Payer: Self-pay | Admitting: Family Medicine

## 2013-12-23 ENCOUNTER — Telehealth: Payer: Self-pay

## 2013-12-23 DIAGNOSIS — Z1239 Encounter for other screening for malignant neoplasm of breast: Secondary | ICD-10-CM

## 2013-12-23 NOTE — Telephone Encounter (Signed)
Pt left v/m; pt tried to schedule screening mammogram at [facility] but because pt has not been seen within the year [place] would not schedule and pt request our office to schedule screening mammo at [place].

## 2013-12-24 NOTE — Telephone Encounter (Signed)
Order entered

## 2013-12-24 NOTE — Telephone Encounter (Signed)
Spoke to pt and informed her order has been placed; states that she will contact them to schedule appt.

## 2013-12-28 ENCOUNTER — Other Ambulatory Visit: Payer: Self-pay | Admitting: Family Medicine

## 2014-01-07 ENCOUNTER — Ambulatory Visit (INDEPENDENT_AMBULATORY_CARE_PROVIDER_SITE_OTHER): Payer: BC Managed Care – PPO | Admitting: Family Medicine

## 2014-01-07 ENCOUNTER — Encounter: Payer: Self-pay | Admitting: Family Medicine

## 2014-01-07 VITALS — BP 116/68 | HR 69 | Temp 97.9°F | Ht 63.75 in | Wt 186.2 lb

## 2014-01-07 DIAGNOSIS — J309 Allergic rhinitis, unspecified: Secondary | ICD-10-CM

## 2014-01-07 MED ORDER — DESLORATADINE-PSEUDOEPHED ER 5-240 MG PO TB24: 1.0000 | ORAL_TABLET | Freq: Every day | ORAL | Status: DC | PRN

## 2014-01-07 NOTE — Assessment & Plan Note (Signed)
Stable on current rx. eRx sent. No changes today.

## 2014-01-07 NOTE — Progress Notes (Signed)
Pre visit review using our clinic review tool, if applicable. No additional management support is needed unless otherwise documented below in the visit note. 

## 2014-01-07 NOTE — Progress Notes (Signed)
   Subjective:   Patient ID: Kathy Byrd, female    DOB: 07-24-1966, 48 y.o.   MRN: [id number]  Kathy Byrd is a pleasant 48 y.o. year old female who presents to clinic today with Medication Refill  on 01/07/2014  HPI: Allergic rhinitis- flonase and claritinex D for past several months, now weaned down to clarinex D only now that allergens are improving. Usually doesn't need these medications in the winter.  Denies any cough, wheezing, fevers, HA or blurred vision.  Current Outpatient Prescriptions on File Prior to Visit  Medication Sig Dispense Refill  . albuterol (PROVENTIL HFA;VENTOLIN HFA) 108 (90 BASE) MCG/ACT inhaler 2 puffs every 4 hours as needed for chest tightness or wheezing      . fluticasone (FLONASE) 50 MCG/ACT nasal spray Place 2 sprays into the nose daily.  48 g  1  . Multiple Vitamins-Minerals (MULTIVITAMIN GUMMIES ADULT PO) Take by mouth daily.       No current facility-administered medications on file prior to visit.    No Known Allergies  No past medical history on file.  Past Surgical History  Procedure Laterality Date  . Wisdom tooth extraction      without anesthesia    Family History  Problem Relation Age of Onset  . Hyperlipidemia Father   . Hypertension Father   . Diabetes Maternal Grandmother   . Depression Sister   . Heart disease Sister     cardiovascular syndrome, genetic    History   Social History  . Marital Status: Married    Spouse Name: N/A    Number of Children: 0  . Years of Education: N/A   Occupational History  .     Social History Main Topics  . Smoking status: Never Smoker   . Smokeless tobacco: Not on file  . Alcohol Use: Not on file  . Drug Use: Not on file  . Sexual Activity: Not on file   Other Topics Concern  . Not on file   Social History Narrative  . No narrative on file   The PMH, PSH, Social History, Family History, Medications, and allergies have been reviewed in [facility], and have been updated if  relevant.  Review of Systems See HPI    Objective:    BP 116/68  Pulse 69  Temp(Src) 97.9 F (36.6 C) (Oral)  Ht 5' 3.75" (1.619 m)  Wt 186 lb 4 oz (84.482 kg)  BMI 32.23 kg/m2  SpO2 97%   Physical Exam  Nursing note and vitals reviewed. Constitutional: She appears well-developed and well-nourished. No distress.  HENT:  Head: Normocephalic and atraumatic.  Right Ear: External ear normal.  Left Ear: External ear normal.  Mouth/Throat: No oropharyngeal exudate.  Neck: Normal range of motion.  Cardiovascular: Normal rate and regular rhythm.   Pulmonary/Chest: No respiratory distress. She has no wheezes. She has no rales.          Assessment & Plan:   ALLERGIC RHINITIS No Follow-up on file.

## 2014-01-12 ENCOUNTER — Telehealth: Payer: Self-pay | Admitting: *Deleted

## 2014-01-12 NOTE — Telephone Encounter (Signed)
Lm on pts vm requesting a call back 

## 2014-01-12 NOTE — Telephone Encounter (Signed)
Received a fax from pharmacy ([facility])  stating that Clarinex D 24 hour tabs are no longer manufactured. They suggest Clarinex 5mg  or Xyzal 5 mg, but either med won't contain a decongestant. If either med is appropriate, pharmacy request a new rx sent.

## 2014-01-12 NOTE — Telephone Encounter (Signed)
Please call pt to ask her what she would like [place] to do.  Neither one contains a decongestant.

## 2014-01-13 MED ORDER — DESLORATADINE 5 MG PO TABS: 5.0000 mg | ORAL_TABLET | Freq: Every day | ORAL | Status: DC

## 2014-01-13 NOTE — Telephone Encounter (Signed)
Pt left v/m; [facility] said [facility] not [facility] allergy med; pt has one dose of med left and pt request rx sent to [place] as well as [facility]. Pt request cb.

## 2014-01-13 NOTE — Telephone Encounter (Signed)
Spoke to pt who requested clarinex be sent to local pharmacy; sent as requested

## 2014-01-14 ENCOUNTER — Telehealth: Payer: Self-pay

## 2014-01-14 NOTE — Telephone Encounter (Signed)
[facility] said Clarinex D 24 had been discontinued and request substitute med called to [phone] using ref # [id number]. Hadid said alternative meds could be Clarinex without D or Claritin D.Please advise.

## 2014-01-14 NOTE — Telephone Encounter (Signed)
Claritin D is appropriate alternative if pt is ok with this.

## 2014-01-14 NOTE — Telephone Encounter (Signed)
See previous note. I have already spoken to the pt and Clarinex has been sent to her requested pharmacy.

## 2014-02-08 ENCOUNTER — Encounter: Payer: Self-pay | Admitting: Family Medicine

## 2014-02-08 ENCOUNTER — Ambulatory Visit: Payer: Self-pay | Admitting: Family Medicine

## 2014-02-23 ENCOUNTER — Telehealth: Payer: Self-pay

## 2014-02-23 MED ORDER — DESLORATADINE 5 MG PO TABS: 5.0000 mg | ORAL_TABLET | Freq: Every day | ORAL | Status: DC

## 2014-02-23 NOTE — Telephone Encounter (Signed)
Spoke to pt and advised per Dr Deborra Medina. Pt requested refill of Clarinex until she is able to get Clarinex D; sent to requested pharmacy

## 2014-02-23 NOTE — Telephone Encounter (Signed)
Claritin D, or allegra D, OTC (behind the counter) is really the only other alternative.

## 2014-02-23 NOTE — Telephone Encounter (Signed)
Pt was seen on 01/07/14; pt has been taking clarinex 5 mg one daily.According to 01/12/14 phone note Clarinex D 24 is no longer manufactured.Pt said Clarinex D was more effective than plain Clarinex; pt wants to know if there is alternative drug pt could try. Pt states Clarinex 5 mg works OK but pt still has nasal congestion that never completely clears. [facility].Please advise.

## 2014-11-22 ENCOUNTER — Ambulatory Visit (INDEPENDENT_AMBULATORY_CARE_PROVIDER_SITE_OTHER): Payer: BLUE CROSS/BLUE SHIELD | Admitting: Family Medicine

## 2014-11-22 ENCOUNTER — Encounter: Payer: Self-pay | Admitting: Family Medicine

## 2014-11-22 VITALS — BP 116/64 | HR 77 | Temp 97.8°F | Wt 192.2 lb

## 2014-11-22 DIAGNOSIS — R635 Abnormal weight gain: Secondary | ICD-10-CM | POA: Diagnosis not present

## 2014-11-22 DIAGNOSIS — L84 Corns and callosities: Secondary | ICD-10-CM

## 2014-11-22 DIAGNOSIS — J309 Allergic rhinitis, unspecified: Secondary | ICD-10-CM | POA: Diagnosis not present

## 2014-11-22 MED ORDER — DESLORATADINE-PSEUDOEPHED ER 5-240 MG PO TB24: 1.0000 | ORAL_TABLET | Freq: Every day | ORAL | Status: DC | PRN

## 2014-11-22 MED ORDER — FLUTICASONE PROPIONATE 50 MCG/ACT NA SUSP: 2.0000 | Freq: Every day | NASAL | Status: DC

## 2014-11-22 MED ORDER — PHENTERMINE HCL 15 MG PO CAPS: 15.0000 mg | ORAL_CAPSULE | ORAL | Status: DC

## 2014-11-22 NOTE — Assessment & Plan Note (Signed)
Refer to podiatry for treatment. Orders Placed This Encounter  Procedures  . Ambulatory referral to Podiatry

## 2014-11-22 NOTE — Assessment & Plan Note (Signed)
Discussed weight loss plan. Discussed phentermine risk benefits, side effects including HTN, pulmonary HTN, stroke again today.    She would like to restart phentermine and lifestyle changes.  Follow up in 1 month.  Did advise NOT to take phentermine with Clarinex D. The patient indicates understanding of these issues and agrees with the plan.

## 2014-11-22 NOTE — Progress Notes (Signed)
Subjective:   Patient ID: Kathy Byrd, female    DOB: 02-29-1966, 49 y.o.   MRN: [id number]  Kathy Byrd is a pleasant 49 y.o. year old female who presents to clinic today with Follow-up  on 11/22/2014  HPI: Allergic rhinitis- flonase daily.  Usually takes claritinex D only when she needs it.  Has not started it yet and does not yet feel she needs this yet.   Denies any cough, wheezing, fevers, HA or blurred vision.  Bony calluses on her feet- getting larger.  Starting to impact her quality of life.  Trying to exercise to lose weight and cannot do this due to foot pain.    Obesity- tried phentermine in past and tolerated it well.  Asking to restart this while she gets her exercise routine back on track. Never had any HA, blurred vision, CP or palpitations when she took it.  Current Outpatient Prescriptions on File Prior to Visit  Medication Sig Dispense Refill  . albuterol (PROVENTIL HFA;VENTOLIN HFA) 108 (90 BASE) MCG/ACT inhaler 2 puffs every 4 hours as needed for chest tightness or wheezing    . Multiple Vitamins-Minerals (MULTIVITAMIN GUMMIES ADULT PO) Take by mouth daily.     No current facility-administered medications on file prior to visit.    No Known Allergies  History reviewed. No pertinent past medical history.  Past Surgical History  Procedure Laterality Date  . Wisdom tooth extraction      without anesthesia    Family History  Problem Relation Age of Onset  . Hyperlipidemia Father   . Hypertension Father   . Diabetes Maternal Grandmother   . Depression Sister   . Heart disease Sister     cardiovascular syndrome, genetic    History   Social History  . Marital Status: Married    Spouse Name: N/A  . Number of Children: 0  . Years of Education: N/A   Occupational History  .     Social History Main Topics  . Smoking status: Never Smoker   . Smokeless tobacco: Not on file  . Alcohol Use: Not on file  . Drug Use: Not on file  . Sexual  Activity: Not on file   Other Topics Concern  . Not on file   Social History Narrative   The PMH, PSH, Social History, Family History, Medications, and allergies have been reviewed in [facility], and have been updated if relevant.  Review of Systems  Constitutional: Negative.   HENT: Positive for congestion and postnasal drip. Negative for facial swelling, hearing loss, rhinorrhea, sinus pressure, sneezing, sore throat, trouble swallowing and voice change.   Respiratory: Negative.   Cardiovascular: Negative.   Skin: Negative.   Hematological: Negative.   Psychiatric/Behavioral: Negative.   All other systems reviewed and are negative.      Objective:    BP 116/64 mmHg  Pulse 77  Temp(Src) 97.8 F (36.6 C) (Oral)  Wt 192 lb 4 oz (87.204 kg)  SpO2 99%  Wt Readings from Last 3 Encounters:  11/22/14 192 lb 4 oz (87.204 kg)  01/07/14 186 lb 4 oz (84.482 kg)  09/18/12 181 lb 12 oz (82.441 kg)     Physical Exam  Constitutional: She appears well-developed and well-nourished. No distress.  HENT:  Head: Normocephalic and atraumatic.  Right Ear: External ear normal.  Left Ear: External ear normal.  Mouth/Throat: No oropharyngeal exudate.  Neck: Normal range of motion.  Cardiovascular: Normal rate and regular rhythm.   Pulmonary/Chest: Effort normal. No  respiratory distress. She has no wheezes. She has no rales.  Musculoskeletal:       Right foot: There is bony tenderness and deformity.  Bony calluses laterally, both feet  Skin: Skin is warm and dry.  Psychiatric: She has a normal mood and affect. Her behavior is normal. Judgment and thought content normal.  Nursing note and vitals reviewed.         Assessment & Plan:   Allergic rhinitis, unspecified allergic rhinitis type  Corn or callus - Plan: Ambulatory referral to Podiatry  Weight gain No Follow-up on file.

## 2014-11-22 NOTE — Assessment & Plan Note (Signed)
Currently well controlled but asking for refill of clarinex rx as well as flonase (has HSA). eRx sent.

## 2014-11-22 NOTE — Patient Instructions (Signed)
Good to see you. Please do NOT take phentermine with clarinex D. Keep me updated.  Go ahead and call podiatry.

## 2014-11-22 NOTE — Progress Notes (Signed)
Pre visit review using our clinic review tool, if applicable. No additional management support is needed unless otherwise documented below in the visit note. 

## 2015-01-16 ENCOUNTER — Encounter: Payer: Self-pay | Admitting: Family Medicine

## 2015-01-16 ENCOUNTER — Ambulatory Visit (INDEPENDENT_AMBULATORY_CARE_PROVIDER_SITE_OTHER): Payer: BLUE CROSS/BLUE SHIELD | Admitting: Family Medicine

## 2015-01-16 VITALS — BP 116/64 | HR 79 | Temp 98.6°F | Wt 188.8 lb

## 2015-01-16 DIAGNOSIS — R059 Cough, unspecified: Secondary | ICD-10-CM | POA: Insufficient documentation

## 2015-01-16 DIAGNOSIS — R05 Cough: Secondary | ICD-10-CM | POA: Diagnosis not present

## 2015-01-16 DIAGNOSIS — J029 Acute pharyngitis, unspecified: Secondary | ICD-10-CM | POA: Diagnosis not present

## 2015-01-16 LAB — POCT RAPID STREP A (OFFICE): Rapid Strep A Screen: POSITIVE — AB

## 2015-01-16 MED ORDER — HYDROCOD POLST-CPM POLST ER 10-8 MG/5ML PO SUER: 5.0000 mL | Freq: Every evening | ORAL | Status: DC | PRN

## 2015-01-16 MED ORDER — AZITHROMYCIN 250 MG PO TABS: ORAL_TABLET | ORAL | Status: DC

## 2015-01-16 NOTE — Progress Notes (Signed)
Subjective:   Patient ID: LODIE Byrd, female    DOB: [birth year], 49 y.o.   MRN: [id number]  Kathy Byrd is a pleasant 49 y.o. year old female who presents to clinic today with Sore Throat  and cough on 01/16/2015  HPI:  Cough an sore throat x 5 days.  Whooping cough exposure- friend of a relative- 10 days ago. Kathy Byrd was coughing so much last night, she could not sleep.  Throat is very sore. Denies fever.  No hemoptysis.  Roommate has similar symptoms.  No CP or SOB.  Current Outpatient Prescriptions on File Prior to Visit  Medication Sig Dispense Refill  . desloratadine-pseudoephedrine (CLARINEX-D 24-HOUR) 5-240 MG per 24 hr tablet Take 1 tablet by mouth daily as needed. 90 tablet 0  . fluticasone (FLONASE) 50 MCG/ACT nasal spray Place 2 sprays into both nostrils daily. 48 g 5   No current facility-administered medications on file prior to visit.    No Known Allergies  History reviewed. No pertinent past medical history.  Past Surgical History  Procedure Laterality Date  . Wisdom tooth extraction      without anesthesia    Family History  Problem Relation Age of Onset  . Hyperlipidemia Father   . Hypertension Father   . Diabetes Maternal Grandmother   . Depression Sister   . Heart disease Sister     cardiovascular syndrome, genetic    History   Social History  . Marital Status: Married    Spouse Name: N/A  . Number of Children: 0  . Years of Education: N/A   Occupational History  .     Social History Main Topics  . Smoking status: Never Smoker   . Smokeless tobacco: Not on file  . Alcohol Use: Not on file  . Drug Use: Not on file  . Sexual Activity: Not on file   Other Topics Concern  . Not on file   Social History Narrative   The PMH, PSH, Social History, Family History, Medications, and allergies have been reviewed in [facility], and have been updated if relevant.   Review of Systems  Constitutional: Negative for fever.  Respiratory: Positive  for cough. Negative for wheezing and stridor.   Cardiovascular: Negative.   Endocrine: Negative.   Genitourinary: Negative.   Musculoskeletal: Negative.   Skin: Negative.   Allergic/Immunologic: Negative.   Neurological: Negative.   Hematological: Negative.   Psychiatric/Behavioral: Negative.   All other systems reviewed and are negative.      Objective:    BP 116/64 mmHg  Pulse 79  Temp(Src) 98.6 F (37 C) (Oral)  Wt 188 lb 12 oz (85.616 kg)  SpO2 99%   Physical Exam  Constitutional: She is oriented to person, place, and time. She appears well-developed and well-nourished. No distress.  HENT:  Head: Normocephalic.  Right Ear: Hearing and tympanic membrane normal.  Left Ear: Hearing and tympanic membrane normal.  Nose: No rhinorrhea. Right sinus exhibits no maxillary sinus tenderness and no frontal sinus tenderness. Left sinus exhibits no maxillary sinus tenderness and no frontal sinus tenderness.  Mouth/Throat: Posterior oropharyngeal erythema present. No oropharyngeal exudate, posterior oropharyngeal edema or tonsillar abscesses.  Eyes: Conjunctivae are normal.  Cardiovascular: Normal rate and regular rhythm.   Pulmonary/Chest: Effort normal and breath sounds normal. No respiratory distress. She has no rales.  Musculoskeletal: Normal range of motion. She exhibits no edema.  Neurological: She is alert and oriented to person, place, and time. No cranial nerve deficit.  Skin: Skin  is warm and dry.  Psychiatric: She has a normal mood and affect. Her behavior is normal. Judgment and thought content normal.  Nursing note and vitals reviewed.         Assessment & Plan:   Cough  Acute pharyngitis, unspecified pharyngitis type - Plan: POCT rapid strep A No Follow-up on file.

## 2015-01-16 NOTE — Patient Instructions (Signed)
Good to see you. Please use tussionex as needed at bedtime- this will make you sleepy.  Ok to fill zpack prescription if symptoms progress or you develop new symptoms.

## 2015-01-16 NOTE — Assessment & Plan Note (Addendum)
Rapid strep positive- due to exposure to whooping cough, treat with zpack. The patient indicates understanding of these issues and agrees with the plan.

## 2015-01-16 NOTE — Assessment & Plan Note (Addendum)
New- Advised supportive care- drink fluids, rx given for prn tussionex.  Advised sedation precautions. Unlikely whooping cough but given rx for zpack since rapid strep positive. The patient indicates understanding of these issues and agrees with the plan.

## 2015-02-24 ENCOUNTER — Telehealth: Payer: Self-pay | Admitting: Family Medicine

## 2015-02-24 NOTE — Telephone Encounter (Signed)
Please call pt to remind her that she is due for a mammogram.

## 2015-02-24 NOTE — Telephone Encounter (Signed)
Lm on pts vm and advised. If she is due, she should be on the mammogram contact list

## 2015-05-30 ENCOUNTER — Ambulatory Visit (INDEPENDENT_AMBULATORY_CARE_PROVIDER_SITE_OTHER): Payer: BLUE CROSS/BLUE SHIELD | Admitting: Family Medicine

## 2015-05-30 ENCOUNTER — Encounter: Payer: Self-pay | Admitting: Family Medicine

## 2015-05-30 VITALS — BP 108/74 | HR 73 | Temp 98.1°F | Wt 175.0 lb

## 2015-05-30 DIAGNOSIS — L237 Allergic contact dermatitis due to plants, except food: Secondary | ICD-10-CM | POA: Diagnosis not present

## 2015-05-30 MED ORDER — PREDNISONE 20 MG PO TABS: ORAL_TABLET | ORAL | Status: DC

## 2015-05-30 NOTE — Progress Notes (Signed)
Pre visit review using our clinic review tool, if applicable. No additional management support is needed unless otherwise documented below in the visit note. 

## 2015-05-30 NOTE — Progress Notes (Signed)
   Subjective:   Patient ID: Kathy Byrd, female    DOB: February 17, 1966, 49 y.o.   MRN: [id number]  Kathy Byrd is a pleasant 49 y.o. year old female who presents to clinic today with Rash  on 05/30/2015  HPI:  ? Poison ivy- 5 days ago after working out side rash developed on back and abdomen near her belt line.  Very itchy, weaping. Calamine lotion topical OTC hydrocortisone cream have not been effective.  No fevers.  No pain.  Takes antihistamine daily.  Current Outpatient Prescriptions on File Prior to Visit  Medication Sig Dispense Refill  . azithromycin (ZITHROMAX) 250 MG tablet 2 tabs by mouth on day 1 followed by 1 tab by mouth daily days 2- 5 6 tablet 0  . chlorpheniramine-HYDROcodone (TUSSIONEX PENNKINETIC ER) 10-8 MG/5ML SUER Take 5 mLs by mouth at bedtime as needed for cough. 140 mL 0  . desloratadine-pseudoephedrine (CLARINEX-D 24-HOUR) 5-240 MG per 24 hr tablet Take 1 tablet by mouth daily as needed. 90 tablet 0  . fluticasone (FLONASE) 50 MCG/ACT nasal spray Place 2 sprays into both nostrils daily. 48 g 5   No current facility-administered medications on file prior to visit.    No Known Allergies  No past medical history on file.  Past Surgical History  Procedure Laterality Date  . Wisdom tooth extraction      without anesthesia    Family History  Problem Relation Age of Onset  . Hyperlipidemia Father   . Hypertension Father   . Diabetes Maternal Grandmother   . Depression Sister   . Heart disease Sister     cardiovascular syndrome, genetic    Social History   Social History  . Marital Status: Married    Spouse Name: N/A  . Number of Children: 0  . Years of Education: N/A   Occupational History  .     Social History Main Topics  . Smoking status: Never Smoker   . Smokeless tobacco: Not on file  . Alcohol Use: Not on file  . Drug Use: Not on file  . Sexual Activity: Not on file   Other Topics Concern  . Not on file   Social History  Narrative   The PMH, PSH, Social History, Family History, Medications, and allergies have been reviewed in [facility], and have been updated if relevant.   Review of Systems  Skin: Positive for rash. Negative for color change, pallor and wound.  All other systems reviewed and are negative.      Objective:    BP 108/74 mmHg  Pulse 73  Temp(Src) 98.1 F (36.7 C) (Oral)  Wt 175 lb (79.379 kg)  SpO2 98%   Physical Exam  Constitutional: She appears well-developed and well-nourished. No distress.  HENT:  Head: Normocephalic.  Eyes: Conjunctivae are normal.  Cardiovascular: Normal rate.   Pulmonary/Chest: Effort normal.  Musculoskeletal: Normal range of motion.  Skin:     Psychiatric: She has a normal mood and affect. Her behavior is normal. Judgment and thought content normal.  Nursing note and vitals reviewed.         Assessment & Plan:   Poison ivy No Follow-up on file.

## 2015-05-30 NOTE — Assessment & Plan Note (Signed)
New- given location and diffuse nature of rash, treat with oral prednisone and continue oral antihistamine as needed for itching. Supportive care as per AVS. Call or return to clinic prn if these symptoms worsen or fail to improve as anticipated. The patient indicates understanding of these issues and agrees with the plan.

## 2015-05-30 NOTE — Patient Instructions (Signed)

## 2015-05-31 ENCOUNTER — Other Ambulatory Visit: Payer: Self-pay | Admitting: Family Medicine

## 2015-05-31 DIAGNOSIS — Z1231 Encounter for screening mammogram for malignant neoplasm of breast: Secondary | ICD-10-CM

## 2015-06-06 ENCOUNTER — Ambulatory Visit
Admission: RE | Admit: 2015-06-06 | Discharge: 2015-06-06 | Disposition: A | Discharge status: Home / Self Care | Payer: BLUE CROSS/BLUE SHIELD | Source: Ambulatory Visit | Attending: Family Medicine | Admitting: Family Medicine

## 2015-06-06 DIAGNOSIS — Z1231 Encounter for screening mammogram for malignant neoplasm of breast: Secondary | ICD-10-CM | POA: Insufficient documentation

## 2015-12-18 ENCOUNTER — Ambulatory Visit (INDEPENDENT_AMBULATORY_CARE_PROVIDER_SITE_OTHER): Payer: BLUE CROSS/BLUE SHIELD | Admitting: Family Medicine

## 2015-12-18 ENCOUNTER — Other Ambulatory Visit: Payer: Self-pay | Admitting: Family Medicine

## 2015-12-18 ENCOUNTER — Encounter: Payer: Self-pay | Admitting: Family Medicine

## 2015-12-18 VITALS — BP 114/82 | HR 59 | Temp 97.6°F | Wt 180.2 lb

## 2015-12-18 DIAGNOSIS — Z0000 Encounter for general adult medical examination without abnormal findings: Secondary | ICD-10-CM | POA: Insufficient documentation

## 2015-12-18 DIAGNOSIS — L237 Allergic contact dermatitis due to plants, except food: Secondary | ICD-10-CM

## 2015-12-18 DIAGNOSIS — Z01419 Encounter for gynecological examination (general) (routine) without abnormal findings: Secondary | ICD-10-CM

## 2015-12-18 MED ORDER — DEXAMETHASONE SODIUM PHOSPHATE 10 MG/ML IJ SOLN
10.0000 mg | Freq: Once | INTRAMUSCULAR | Status: AC
  Administered 2015-12-18: 10 mg via INTRAMUSCULAR

## 2015-12-18 MED ORDER — PREDNISONE 20 MG PO TABS: ORAL_TABLET | ORAL | Status: DC

## 2015-12-18 NOTE — Assessment & Plan Note (Signed)
New- given diffuse nature of rash, IM decadron given in office. eRx sent for oral prednisone taper for her to start in 1-2 days. Supportive care with oral antihistamines. Call or return to clinic prn if these symptoms worsen or fail to improve as anticipated.

## 2015-12-18 NOTE — Progress Notes (Signed)
   Subjective:   Patient ID: Kathy Byrd, female    DOB: June 30, 1966, 50 y.o.   MRN: [id number]  Kathy Byrd is a pleasant 50 y.o. year old female who presents to clinic today with Rash  on 12/18/2015  HPI:  ? Poison ivy- 5 days ago after being out side rash developed on neck, back and abdomen.   Very itchy, weaping. Calamine lotion topical OTC hydrocortisone cream have not been effective.  No fevers.  No pain.  Takes antihistamine daily.  Current Outpatient Prescriptions on File Prior to Visit  Medication Sig Dispense Refill  . desloratadine-pseudoephedrine (CLARINEX-D 24-HOUR) 5-240 MG per 24 hr tablet Take 1 tablet by mouth daily as needed. 90 tablet 0   No current facility-administered medications on file prior to visit.    No Known Allergies  No past medical history on file.  Past Surgical History  Procedure Laterality Date  . Wisdom tooth extraction      without anesthesia    Family History  Problem Relation Age of Onset  . Hyperlipidemia Father   . Hypertension Father   . Diabetes Maternal Grandmother   . Depression Sister   . Heart disease Sister     cardiovascular syndrome, genetic    Social History   Social History  . Marital Status: Married    Spouse Name: N/A  . Number of Children: 0  . Years of Education: N/A   Occupational History  .     Social History Main Topics  . Smoking status: Never Smoker   . Smokeless tobacco: Not on file  . Alcohol Use: Not on file  . Drug Use: Not on file  . Sexual Activity: Not on file   Other Topics Concern  . Not on file   Social History Narrative   The PMH, PSH, Social History, Family History, Medications, and allergies have been reviewed in [facility], and have been updated if relevant.   Review of Systems  Skin: Positive for rash. Negative for color change, pallor and wound.  All other systems reviewed and are negative.      Objective:    BP 114/82 mmHg  Pulse 59  Temp(Src) 97.6 F (36.4 C)  (Oral)  Wt 180 lb 4 oz (81.761 kg)  SpO2 99%   Physical Exam  Constitutional: She appears well-developed and well-nourished. No distress.  HENT:  Head: Normocephalic.  Eyes: Conjunctivae are normal.  Cardiovascular: Normal rate.   Pulmonary/Chest: Effort normal.  Musculoskeletal: Normal range of motion.  Skin:     Psychiatric: She has a normal mood and affect. Her behavior is normal. Judgment and thought content normal.  Nursing note and vitals reviewed.         Assessment & Plan:   Poison ivy No Follow-up on file.

## 2015-12-18 NOTE — Progress Notes (Signed)
Pre visit review using our clinic review tool, if applicable. No additional management support is needed unless otherwise documented below in the visit note. 

## 2015-12-18 NOTE — Patient Instructions (Signed)
Good to see you. Please start prednisone in the next couple of days.  Keep me updated.

## 2015-12-20 ENCOUNTER — Other Ambulatory Visit (INDEPENDENT_AMBULATORY_CARE_PROVIDER_SITE_OTHER): Payer: BLUE CROSS/BLUE SHIELD

## 2015-12-20 DIAGNOSIS — Z0000 Encounter for general adult medical examination without abnormal findings: Secondary | ICD-10-CM

## 2015-12-20 DIAGNOSIS — Z01419 Encounter for gynecological examination (general) (routine) without abnormal findings: Secondary | ICD-10-CM

## 2015-12-20 LAB — COMPREHENSIVE METABOLIC PANEL
ALBUMIN: 4.4 g/dL (ref 3.5–5.2)
ALT: 12 U/L (ref 0–35)
AST: 14 U/L (ref 0–37)
Alkaline Phosphatase: 68 U/L (ref 39–117)
BUN: 17 mg/dL (ref 6–23)
CALCIUM: 9.5 mg/dL (ref 8.4–10.5)
CHLORIDE: 105 meq/L (ref 96–112)
CO2: 30 meq/L (ref 19–32)
Creatinine, Ser: 0.83 mg/dL (ref 0.40–1.20)
GFR: 76.76 mL/min (ref >60.00)
Glucose, Bld: 90 mg/dL (ref 70–99)
POTASSIUM: 3.7 meq/L (ref 3.5–5.1)
SODIUM: 142 meq/L (ref 135–145)
Total Bilirubin: 0.4 mg/dL (ref 0.2–1.2)
Total Protein: 7.4 g/dL (ref 6.0–8.3)

## 2015-12-20 LAB — CBC WITH DIFFERENTIAL/PLATELET
BASOS PCT: 0.4 % (ref 0.0–3.0)
Basophils Absolute: 0 10*3/uL (ref 0.0–0.1)
EOS ABS: 0 10*3/uL (ref 0.0–0.7)
EOS PCT: 0.1 % (ref 0.0–5.0)
HCT: 40.1 % (ref 36.0–46.0)
Hemoglobin: 13.6 g/dL (ref 12.0–15.0)
LYMPHS ABS: 2.4 10*3/uL (ref 0.7–4.0)
Lymphocytes Relative: 26.7 % (ref 12.0–46.0)
MCHC: 33.8 g/dL (ref 30.0–36.0)
MCV: 86.9 fl (ref 78.0–100.0)
MONO ABS: 0.4 10*3/uL (ref 0.1–1.0)
Monocytes Relative: 4.4 % (ref 3.0–12.0)
Neutro Abs: 6.1 10*3/uL (ref 1.4–7.7)
Neutrophils Relative %: 68.4 % (ref 43.0–77.0)
PLATELETS: 250 10*3/uL (ref 150.0–400.0)
RBC: 4.62 Mil/uL (ref 3.87–5.11)
RDW: 13.5 % (ref 11.5–15.5)
WBC: 8.9 10*3/uL (ref 4.0–10.5)

## 2015-12-20 LAB — LIPID PANEL
CHOL/HDL RATIO: 3
CHOLESTEROL: 242 mg/dL — AB (ref 0–200)
HDL: 94.6 mg/dL (ref >39.00)
LDL CALC: 127 mg/dL — AB (ref 0–99)
NonHDL: 147.68
TRIGLYCERIDES: 101 mg/dL (ref 0.0–149.0)
VLDL: 20.2 mg/dL (ref 0.0–40.0)

## 2015-12-20 LAB — TSH: TSH: 1.32 u[IU]/mL (ref 0.35–4.50)

## 2015-12-26 ENCOUNTER — Other Ambulatory Visit (HOSPITAL_COMMUNITY)
Admission: RE | Admit: 2015-12-26 | Discharge: 2015-12-26 | Disposition: A | Discharge status: Home / Self Care | Payer: BLUE CROSS/BLUE SHIELD | Source: Ambulatory Visit | Attending: Family Medicine | Admitting: Family Medicine

## 2015-12-26 ENCOUNTER — Ambulatory Visit (INDEPENDENT_AMBULATORY_CARE_PROVIDER_SITE_OTHER): Payer: BLUE CROSS/BLUE SHIELD | Admitting: Family Medicine

## 2015-12-26 ENCOUNTER — Encounter: Payer: Self-pay | Admitting: Family Medicine

## 2015-12-26 VITALS — BP 128/80 | HR 70 | Temp 97.8°F

## 2015-12-26 DIAGNOSIS — Z1151 Encounter for screening for human papillomavirus (HPV): Secondary | ICD-10-CM | POA: Insufficient documentation

## 2015-12-26 DIAGNOSIS — J309 Allergic rhinitis, unspecified: Secondary | ICD-10-CM | POA: Diagnosis not present

## 2015-12-26 DIAGNOSIS — Z0000 Encounter for general adult medical examination without abnormal findings: Secondary | ICD-10-CM | POA: Diagnosis not present

## 2015-12-26 DIAGNOSIS — Z01419 Encounter for gynecological examination (general) (routine) without abnormal findings: Secondary | ICD-10-CM

## 2015-12-26 DIAGNOSIS — Z1211 Encounter for screening for malignant neoplasm of colon: Secondary | ICD-10-CM

## 2015-12-26 NOTE — Assessment & Plan Note (Signed)
Reviewed preventive care protocols, scheduled due services, and updated immunizations Discussed nutrition, exercise, diet, and healthy lifestyle.  Refer to GI for screening colonoscopy.  Pap smear done today.

## 2015-12-26 NOTE — Progress Notes (Signed)
Subjective:   Patient ID: Kathy Byrd, female    DOB: Dec 17, 1965, 50 y.o.   MRN: [id number]  Kathy Byrd is a pleasant 50 y.o. year old female who presents to clinic today with Annual Exam  on 12/26/2015  HPI:  Has not had a pap smear in over 10 days.  No h/o PMD. Mammogram 05/2015.  Has never had a colonoscopy   Lab Results  Component Value Date   WBC 8.9 12/20/2015   HGB 13.6 12/20/2015   HCT 40.1 12/20/2015   MCV 86.9 12/20/2015   PLT 250.0 12/20/2015   Lab Results  Component Value Date   ALT 12 12/20/2015   AST 14 12/20/2015   ALKPHOS 68 12/20/2015   BILITOT 0.4 12/20/2015   Lab Results  Component Value Date   CREATININE 0.83 12/20/2015   Lab Results  Component Value Date   CHOL 242* 12/20/2015   HDL 94.60 12/20/2015   LDLCALC 127* 12/20/2015   LDLDIRECT 111.8 10/14/2011   TRIG 101.0 12/20/2015   CHOLHDL 3 12/20/2015   Lab Results  Component Value Date   TSH 1.32 12/20/2015      Current Outpatient Prescriptions on File Prior to Visit  Medication Sig Dispense Refill  . predniSONE (DELTASONE) 20 MG tablet 3 tabs by mouth daily x 3 days, 2 tabs by mouth daily x 2 days, 1 tab by mouth daily x 2 days, 1/2 tab by mouth daily x 2 days 16 tablet 0   No current facility-administered medications on file prior to visit.    No Known Allergies  No past medical history on file.  Past Surgical History  Procedure Laterality Date  . Wisdom tooth extraction      without anesthesia    Family History  Problem Relation Age of Onset  . Hyperlipidemia Father   . Hypertension Father   . Diabetes Maternal Grandmother   . Depression Sister   . Heart disease Sister     cardiovascular syndrome, genetic    Social History   Social History  . Marital Status: Married    Spouse Name: N/A  . Number of Children: 0  . Years of Education: N/A   Occupational History  .     Social History Main Topics  . Smoking status: Never Smoker   . Smokeless tobacco:  Not on file  . Alcohol Use: Not on file  . Drug Use: Not on file  . Sexual Activity: Not on file   Other Topics Concern  . Not on file   Social History Narrative   The PMH, PSH, Social History, Family History, Medications, and allergies have been reviewed in [facility], and have been updated if relevant.  Review of Systems  Constitutional: Negative.   HENT: Positive for congestion and postnasal drip. Negative for facial swelling, hearing loss, rhinorrhea, sinus pressure, sneezing, sore throat, trouble swallowing and voice change.   Respiratory: Negative.   Cardiovascular: Negative.   Skin: Negative.   Hematological: Negative.   Psychiatric/Behavioral: Negative.   All other systems reviewed and are negative.      Objective:    BP 128/80 mmHg  Pulse 70  Temp(Src) 97.8 F (36.6 C) (Oral)  SpO2 96%  Wt Readings from Last 3 Encounters:  12/18/15 180 lb 4 oz (81.761 kg)  05/30/15 175 lb (79.379 kg)  01/16/15 188 lb 12 oz (85.616 kg)     Physical Exam  Constitutional: She is oriented to person, place, and time. She appears well-developed and well-nourished. No  distress.  HENT:  Head: Normocephalic and atraumatic.  Right Ear: External ear normal.  Left Ear: External ear normal.  Mouth/Throat: No oropharyngeal exudate.  Eyes: Conjunctivae are normal.  Neck: Normal range of motion.  Cardiovascular: Normal rate and regular rhythm.   Pulmonary/Chest: Effort normal. No respiratory distress. She has no wheezes. She has no rales.  Abdominal: Soft. Bowel sounds are normal. She exhibits no distension and no mass. There is no tenderness. There is no rebound and no guarding. Hernia confirmed negative in the right inguinal area and confirmed negative in the left inguinal area.  Genitourinary: Rectum normal, vagina normal and uterus normal. No breast swelling, tenderness, discharge or bleeding. There is no rash on the right labia. There is no rash on the left labia. Cervix exhibits no motion  tenderness, no discharge and no friability. Right adnexum displays no mass, no tenderness and no fullness. Left adnexum displays no mass, no tenderness and no fullness.  Musculoskeletal: Normal range of motion.       Right foot: There is bony tenderness and deformity.  Lymphadenopathy:       Right: No inguinal adenopathy present.       Left: No inguinal adenopathy present.  Neurological: She is alert and oriented to person, place, and time. No cranial nerve deficit.  Skin: Skin is warm and dry.  Psychiatric: She has a normal mood and affect. Her behavior is normal. Judgment and thought content normal.  Nursing note and vitals reviewed.         Assessment & Plan:   Allergic rhinitis, unspecified allergic rhinitis type  Well woman exam No Follow-up on file.

## 2015-12-26 NOTE — Patient Instructions (Signed)
Great to see you. Please stop by to see Rosaria Ferries on your way out to schedule your GI appointment.  Happy birthday!

## 2015-12-26 NOTE — Progress Notes (Signed)
Pre visit review using our clinic review tool, if applicable. No additional management support is needed unless otherwise documented below in the visit note. 

## 2015-12-27 LAB — CYTOLOGY - PAP

## 2015-12-28 ENCOUNTER — Encounter: Payer: Self-pay | Admitting: *Deleted

## 2015-12-29 ENCOUNTER — Encounter: Payer: Self-pay | Admitting: Family Medicine

## 2015-12-29 ENCOUNTER — Ambulatory Visit (INDEPENDENT_AMBULATORY_CARE_PROVIDER_SITE_OTHER): Payer: BLUE CROSS/BLUE SHIELD | Admitting: Family Medicine

## 2015-12-29 VITALS — BP 132/82 | HR 70 | Temp 97.5°F | Wt 182.5 lb

## 2015-12-29 DIAGNOSIS — L237 Allergic contact dermatitis due to plants, except food: Secondary | ICD-10-CM

## 2015-12-29 MED ORDER — PREDNISONE 20 MG PO TABS: ORAL_TABLET | ORAL | Status: DC

## 2015-12-29 MED ORDER — TRIAMCINOLONE ACETONIDE 0.1 % EX CREA: 1.0000 "application " | TOPICAL_CREAM | Freq: Two times a day (BID) | CUTANEOUS | Status: DC

## 2015-12-29 NOTE — Patient Instructions (Signed)
Topical steroid first - twice daily for next 1-2 weeks If no improvement with topical, or if you continue to see spread, then fill second oral prednisone taper.  Let us know if not improving with treatment.

## 2015-12-29 NOTE — Progress Notes (Signed)
Pre visit review using our clinic review tool, if applicable. No additional management support is needed unless otherwise documented below in the visit note. 

## 2015-12-29 NOTE — Assessment & Plan Note (Signed)
Anticipate ongoing poison ivy dermatitis - given initial rash has largely resolved and current new lesions are isolated to L side of body, treat with topical triamcinolone cream to use twice daily for next 2 weeks. If spreading or not improving, provided with prednisone taper. Update if not improved with this.

## 2015-12-29 NOTE — Progress Notes (Signed)
   BP 132/82 mmHg  Pulse 70  Temp(Src) 97.5 F (36.4 C) (Oral)  Wt 182 lb 8 oz (82.781 kg)  SpO2 98%   CC: poison ivy worsening  Subjective:    Patient ID: Kathy Byrd, female    DOB: Feb 10, 1966, 50 y.o.   MRN: [id number]  HPI: ALARIA CHIUSANO is a 50 y.o. female presenting on 12/29/2015 for Rash   Seen by PCP earlier this month (12/18/2015) with poison ivy dx - treated with IM decadron and prednisone PO 8d course. Didn't fully clear. Mildly itchy. Initially around neck/torso, inner thighs - that is now better. 4d ago noticed new spots popping up at L flank into thigh that may be spreading.   Hasn't been around poison ivy recently.  No fevers/chills, nausea, oral lesions. No recent tick bites. No new lotions, detergents, soaps or shampoos. No new medications or foods.  Relevant past medical, surgical, family and social history reviewed and updated as indicated. Interim medical history since our last visit reviewed. Allergies and medications reviewed and updated. No current outpatient prescriptions on file prior to visit.   No current facility-administered medications on file prior to visit.    Review of Systems Per HPI unless specifically indicated in ROS section     Objective:    BP 132/82 mmHg  Pulse 70  Temp(Src) 97.5 F (36.4 C) (Oral)  Wt 182 lb 8 oz (82.781 kg)  SpO2 98%  Wt Readings from Last 3 Encounters:  12/29/15 182 lb 8 oz (82.781 kg)  12/18/15 180 lb 4 oz (81.761 kg)  05/30/15 175 lb (79.379 kg)    Physical Exam  Constitutional: She appears well-developed and well-nourished. No distress.  Skin: Skin is warm and dry. Rash noted.  Initial rash largely resolved New fainter patches of erythematous papular rash with excoriations along L flank into L lateral thigh, mildly pruritic. Not tender, not vesicular  Nursing note and vitals reviewed.     Assessment & Plan:   Problem List Items Addressed This Visit    Poison ivy dermatitis - Primary   Anticipate ongoing poison ivy dermatitis - given initial rash has largely resolved and current new lesions are isolated to L side of body, treat with topical triamcinolone cream to use twice daily for next 2 weeks. If spreading or not improving, provided with prednisone taper. Update if not improved with this.          Follow up plan: Return if symptoms worsen or fail to improve.  Ria Bush, MD

## 2016-02-07 ENCOUNTER — Ambulatory Visit (AMBULATORY_SURGERY_CENTER): Payer: Self-pay | Admitting: *Deleted

## 2016-02-07 VITALS — Ht 64.0 in | Wt 183.0 lb

## 2016-02-07 DIAGNOSIS — Z1211 Encounter for screening for malignant neoplasm of colon: Secondary | ICD-10-CM

## 2016-02-07 MED ORDER — NA SULFATE-K SULFATE-MG SULF 17.5-3.13-1.6 GM/177ML PO SOLN: 1.0000 | Freq: Once | ORAL | Status: DC

## 2016-02-07 NOTE — Progress Notes (Signed)
No egg or soy allergy. No anesthesia problems.  No home O2.  No diet meds.  

## 2016-02-21 ENCOUNTER — Encounter: Payer: BLUE CROSS/BLUE SHIELD | Admitting: Internal Medicine

## 2016-02-29 ENCOUNTER — Ambulatory Visit (AMBULATORY_SURGERY_CENTER): Payer: BLUE CROSS/BLUE SHIELD | Admitting: Internal Medicine

## 2016-02-29 ENCOUNTER — Encounter: Payer: Self-pay | Admitting: Internal Medicine

## 2016-02-29 VITALS — BP 116/64 | HR 54 | Temp 97.8°F | Resp 13 | Ht 64.0 in | Wt 183.0 lb

## 2016-02-29 DIAGNOSIS — Z1211 Encounter for screening for malignant neoplasm of colon: Secondary | ICD-10-CM

## 2016-02-29 DIAGNOSIS — D122 Benign neoplasm of ascending colon: Secondary | ICD-10-CM

## 2016-02-29 DIAGNOSIS — D123 Benign neoplasm of transverse colon: Secondary | ICD-10-CM | POA: Diagnosis not present

## 2016-02-29 MED ORDER — SODIUM CHLORIDE 0.9 % IV SOLN: 500.0000 mL | INTRAVENOUS | Status: DC

## 2016-02-29 NOTE — Progress Notes (Signed)
Report to PACU, RN, vss, BBS= Clear.  

## 2016-02-29 NOTE — Patient Instructions (Addendum)
YOU HAD AN ENDOSCOPIC PROCEDURE TODAY AT [facility]:   Refer to the procedure report that was given to you for any specific questions about what was found during the examination.  If the procedure report does not answer your questions, please call your gastroenterologist to clarify.  If you requested that your care partner not be given the details of your procedure findings, then the procedure report has been included in a sealed envelope for you to review at your convenience later.  YOU SHOULD EXPECT: Some feelings of bloating in the abdomen. Passage of more gas than usual.  Walking can help get rid of the air that was put into your GI tract during the procedure and reduce the bloating. If you had a lower endoscopy (such as a colonoscopy or flexible sigmoidoscopy) you may notice spotting of blood in your stool or on the toilet paper. If you underwent a bowel prep for your procedure, you may not have a normal bowel movement for a few days.  Please Note:  You might notice some irritation and congestion in your nose or some drainage.  This is from the oxygen used during your procedure.  There is no need for concern and it should clear up in a day or so.  SYMPTOMS TO REPORT IMMEDIATELY:   Following lower endoscopy (colonoscopy or flexible sigmoidoscopy):  Excessive amounts of blood in the stool  Significant tenderness or worsening of abdominal pains  Swelling of the abdomen that is new, acute  Fever of 100F or higher  For urgent or emergent issues, a gastroenterologist can be reached at any hour by calling [phone].   DIET: Your first meal following the procedure should be a small meal and then it is ok to progress to your normal diet. Heavy or fried foods are harder to digest and may make you feel nauseous or bloated.  Likewise, meals heavy in dairy and vegetables can increase bloating.  Drink plenty of fluids but you should avoid alcoholic beverages for 24  hours.  ACTIVITY:  You should plan to take it easy for the rest of today and you should NOT DRIVE or use heavy machinery until tomorrow (because of the sedation medicines used during the test).    FOLLOW UP: Our staff will call the number listed on your records the next business day following your procedure to check on you and address any questions or concerns that you may have regarding the information given to you following your procedure. If we do not reach you, we will leave a message.  However, if you are feeling well and you are not experiencing any problems, there is no need to return our call.  We will assume that you have returned to your regular daily activities without incident.  If any biopsies were taken you will be contacted by phone or by letter within the next 1-3 weeks.  Please call us at [phone] if you have not heard about the biopsies in 3 weeks.    SIGNATURES/CONFIDENTIALITY: You and/or your care partner have signed paperwork which will be entered into your electronic medical record.  These signatures attest to the fact that that the information above on your After Visit Summary has been reviewed and is understood.  Full responsibility of the confidentiality of this discharge information lies with you and/or your care-partner.  Please review polyp handout provided.

## 2016-02-29 NOTE — Progress Notes (Signed)
Called to room to assist during endoscopic procedure.  Patient ID and intended procedure confirmed with present staff. Received instructions for my participation in the procedure from the performing physician.  

## 2016-02-29 NOTE — Op Note (Signed)
[place] Patient Name: Kathy Byrd Procedure Date: 02/29/2016 7:41 AM MRN: [id number] Endoscopist: Jerene Bears , MD Age: 50 Referring MD:  Date of Birth: 01/18/1966 Gender: Female Account #: [id number] Procedure:                Colonoscopy Indications:              Screening for colorectal malignant neoplasm, This                            is the patient's first colonoscopy Medicines:                Monitored Anesthesia Care Procedure:                Pre-Anesthesia Assessment:                           - Prior to the procedure, a History and Physical                            was performed, and patient medications and                            allergies were reviewed. The patient's tolerance of                            previous anesthesia was also reviewed. The risks                            and benefits of the procedure and the sedation                            options and risks were discussed with the patient.                            All questions were answered, and informed consent                            was obtained. Prior Anticoagulants: The patient has                            taken no previous anticoagulant or antiplatelet                            agents. ASA Grade Assessment: II - A patient with                            mild systemic disease. After reviewing the risks                            and benefits, the patient was deemed in                            satisfactory condition to undergo the procedure.  After obtaining informed consent, the colonoscope                            was passed under direct vision. Throughout the                            procedure, the patient's blood pressure, pulse, and                            oxygen saturations were monitored continuously. The                            Model PCF-[id number]) scope was introduced                            through the anus and  advanced to the the cecum,                            identified by appendiceal orifice and ileocecal                            valve. The colonoscopy was performed without                            difficulty. The patient tolerated the procedure                            well. The quality of the bowel preparation was                            excellent. The ileocecal valve, appendiceal                            orifice, and rectum were photographed. Scope In: 8:12:54 AM Scope Out: 8:36:48 AM Scope Withdrawal Time: 0 hours 18 minutes 14 seconds  Total Procedure Duration: 0 hours 23 minutes 54 seconds  Findings:                 The digital rectal exam was normal.                           A 7 mm polyp was found in the ascending colon. The                            polyp was semi-pedunculated. The polyp was removed                            with a cold snare. Resection and retrieval were                            complete.                           Two sessile polyps were found in the transverse  colon. The polyps were 3 to 5 mm in size. These                            polyps were removed with a cold snare. Resection                            and retrieval were complete.                           Anal papilla(e) were hypertrophied.                           The exam was otherwise without abnormality on                            direct and retroflexion views. Complications:            No immediate complications. Estimated Blood Loss:     Estimated blood loss was minimal. Impression:               - One 7 mm polyp in the ascending colon, removed                            with a cold snare. Resected and retrieved.                           - Two 3 to 5 mm polyps in the transverse colon,                            removed with a cold snare. Resected and retrieved.                           - Anal papilla(e) were hypertrophied.                           -  The examination was otherwise normal on direct                            and retroflexion views. Recommendation:           - Patient has a contact number available for                            emergencies. The signs and symptoms of potential                            delayed complications were discussed with the                            patient. Return to normal activities tomorrow.                            Written discharge instructions were provided to the  patient.                           - Resume previous diet.                           - Continue present medications.                           - Await pathology results.                           - Repeat colonoscopy is recommended. The                            colonoscopy date will be determined after pathology                            results from today's exam become available for                            review. Jerene Bears, MD 02/29/2016 8:40:37 AM This report has been signed electronically.

## 2016-03-01 ENCOUNTER — Telehealth: Payer: Self-pay | Admitting: *Deleted

## 2016-03-01 NOTE — Telephone Encounter (Signed)
  Follow up Call-  Call back number 02/29/2016  Post procedure Call Back phone  # [phone]  Permission to leave phone message Yes     Patient questions:  Do you have a fever, pain , or abdominal swelling? No. Pain Score  0 *  Have you tolerated food without any problems? Yes.    Have you been able to return to your normal activities? Yes.    Do you have any questions about your discharge instructions: Diet   No. Medications  No. Follow up visit  No.  Do you have questions or concerns about your Care? No.  Actions: * If pain score is 4 or above: No action needed, pain <4.

## 2016-03-06 ENCOUNTER — Encounter: Payer: Self-pay | Admitting: Internal Medicine

## 2016-05-06 ENCOUNTER — Other Ambulatory Visit: Payer: Self-pay | Admitting: Family Medicine

## 2016-05-06 DIAGNOSIS — Z1231 Encounter for screening mammogram for malignant neoplasm of breast: Secondary | ICD-10-CM

## 2016-06-03 IMAGING — MG MM DIGITAL SCREENING BILATERAL
4 series · 4 of 4 positions shown · non-contrast
Comparison: Previous exam(s).

CLINICAL DATA: Screening.

EXAM:
DIGITAL SCREENING BILATERAL MAMMOGRAM WITH CAD

[R CC]
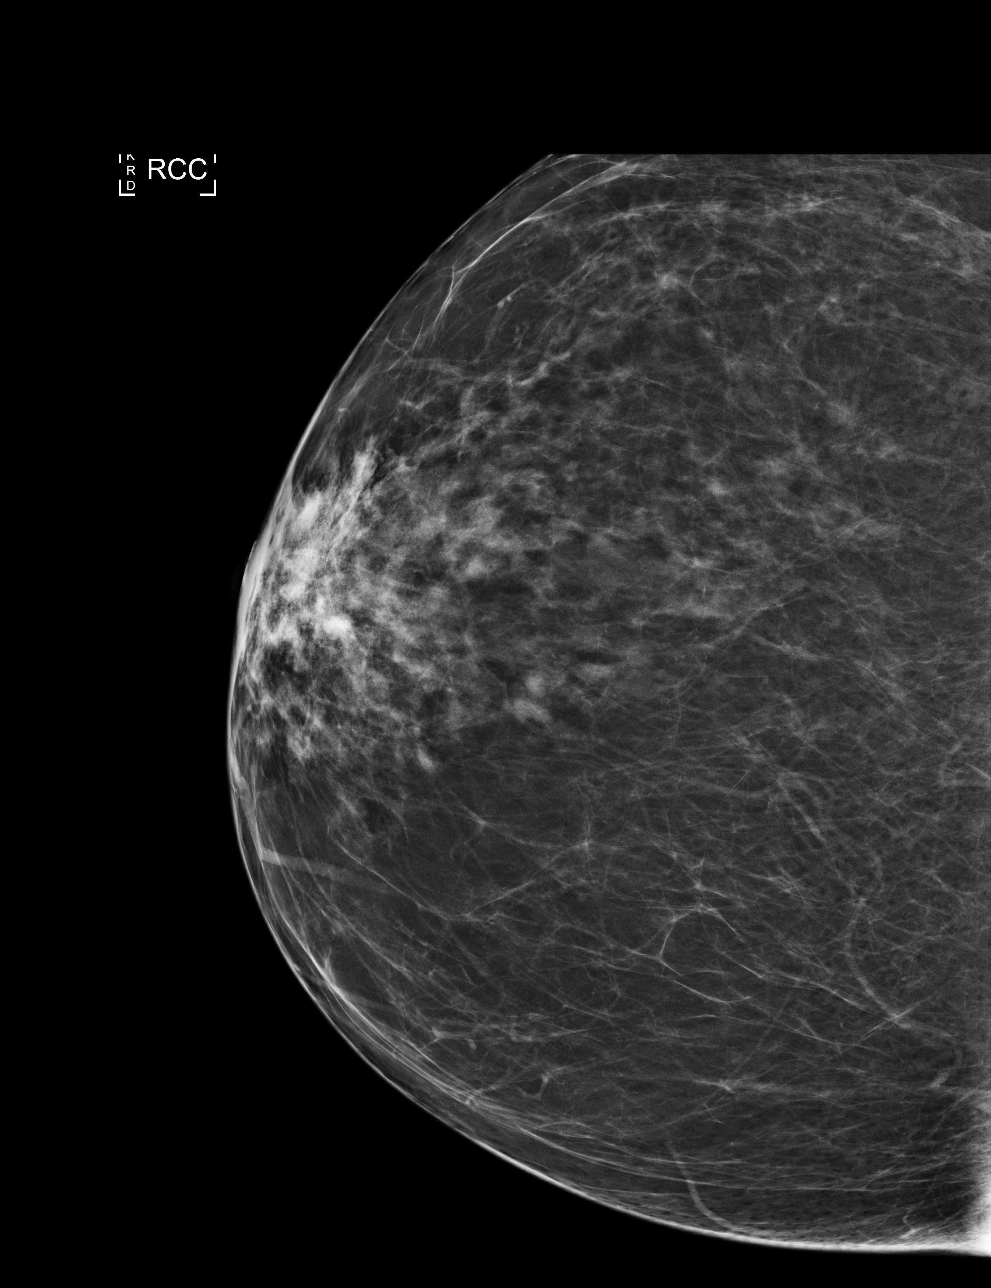

[L CC]
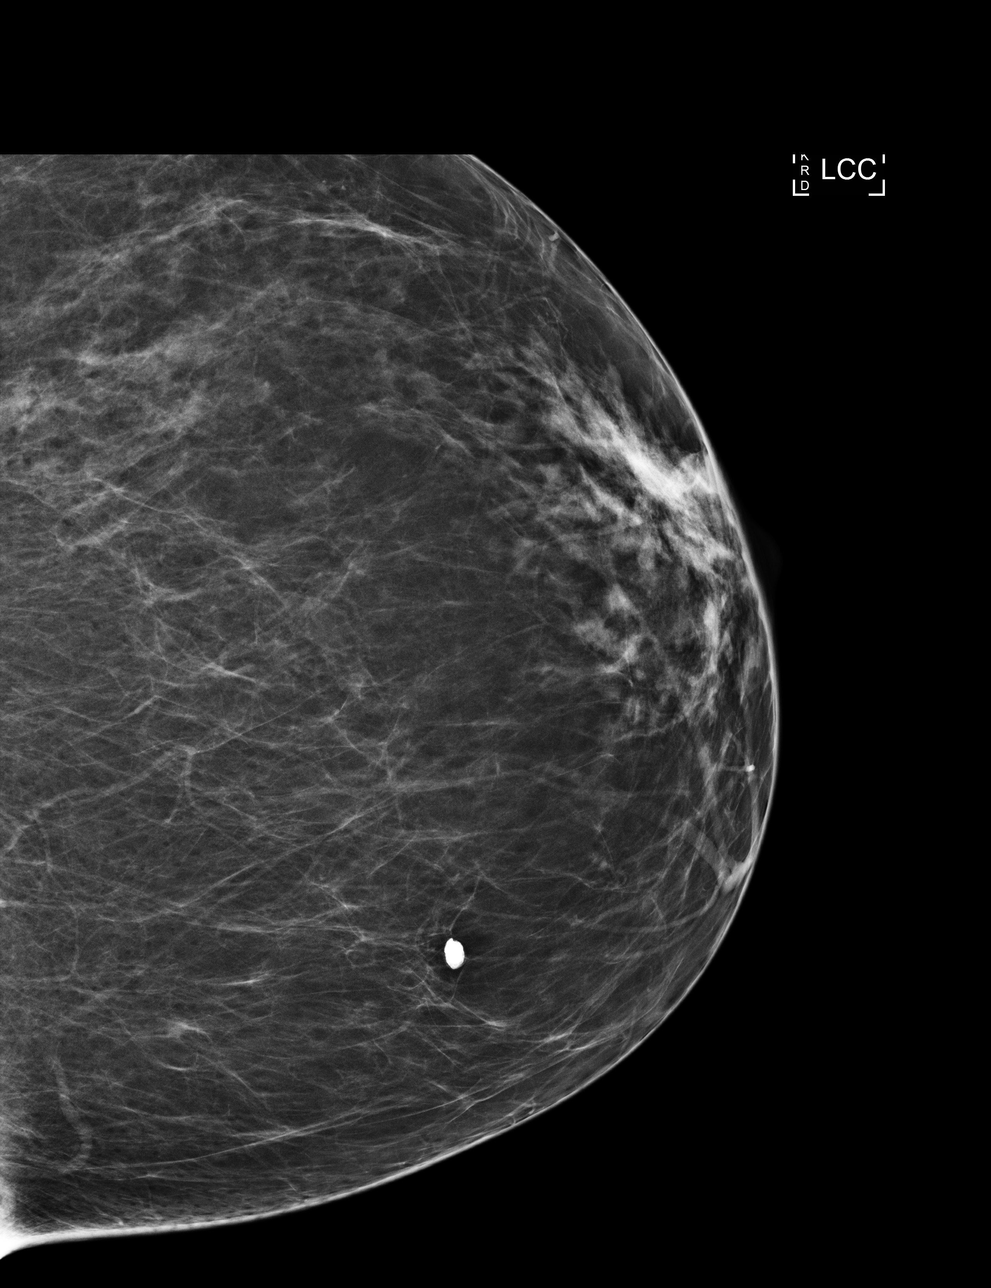

[L MLO]
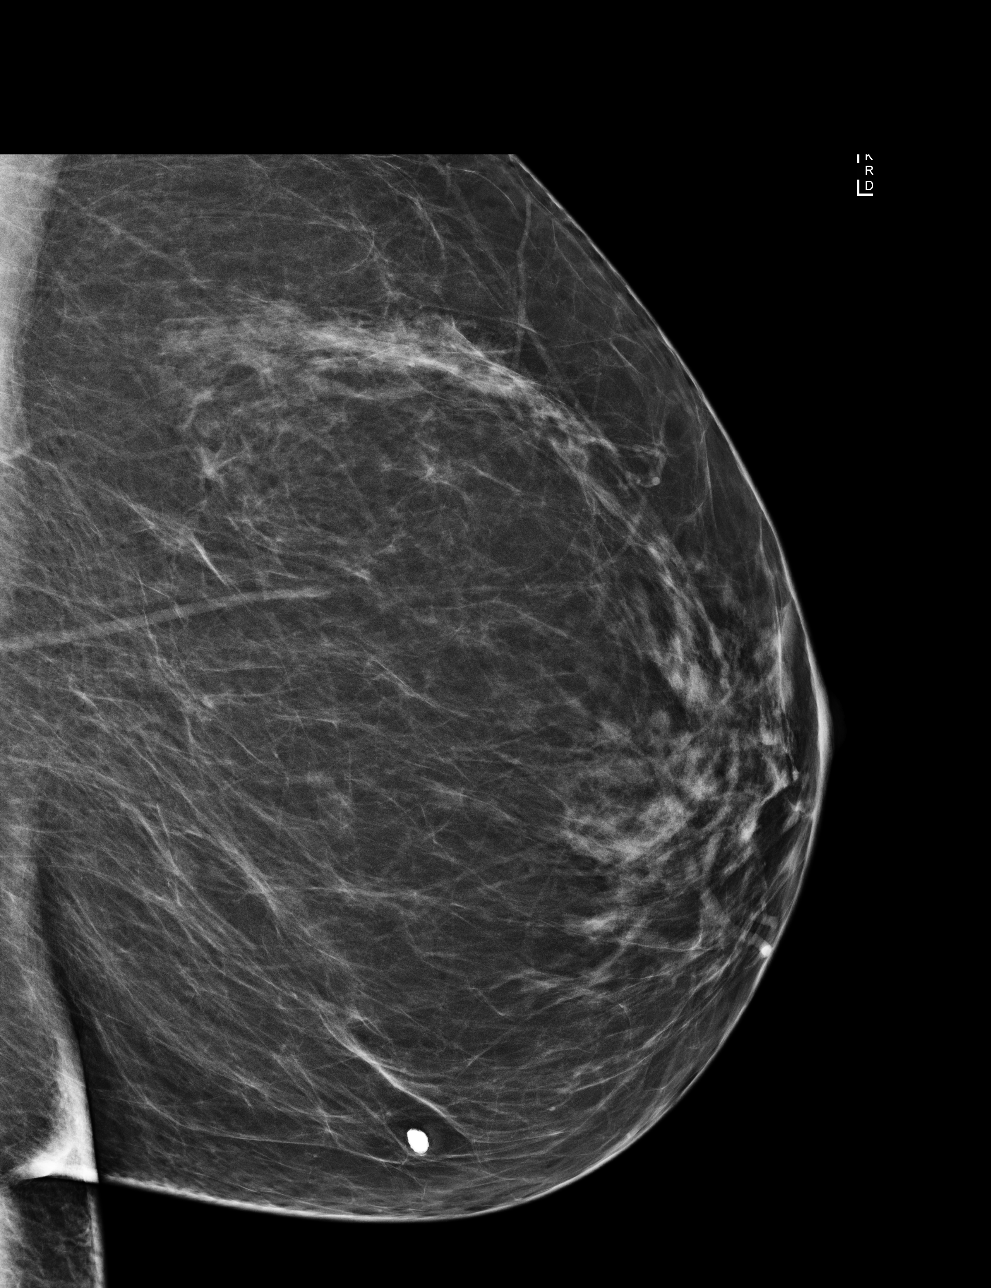

[R MLO]
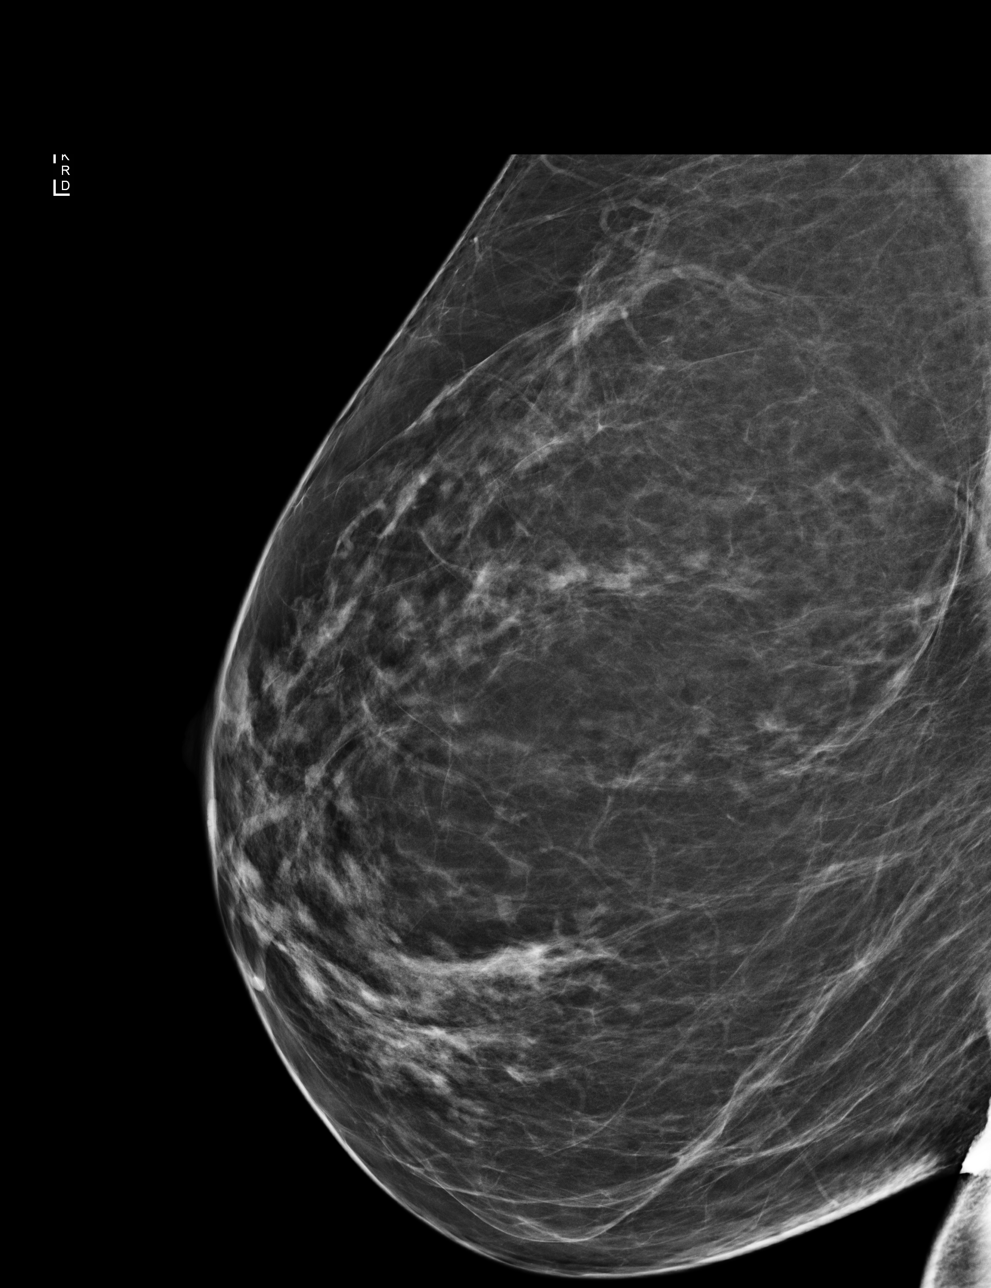

[4 of 4 positions shown; findings below may reference images not displayed]

ACR Breast Density Category b: There are scattered areas of
fibroglandular density.
FINDINGS: There are no findings suspicious for malignancy. Images were
processed with CAD.
IMPRESSION: No mammographic evidence of malignancy. A result letter of this
screening mammogram will be mailed directly to the patient.

RECOMMENDATION:
Screening mammogram in one year. (Code:AS-G-LCT)

BI-RADS CATEGORY  1: Negative.

## 2016-06-13 ENCOUNTER — Ambulatory Visit
Admission: RE | Admit: 2016-06-13 | Discharge: 2016-06-13 | Disposition: A | Discharge status: Home / Self Care | Payer: BLUE CROSS/BLUE SHIELD | Source: Ambulatory Visit | Attending: Family Medicine | Admitting: Family Medicine

## 2016-06-13 DIAGNOSIS — Z1231 Encounter for screening mammogram for malignant neoplasm of breast: Secondary | ICD-10-CM | POA: Insufficient documentation

## 2016-06-18 ENCOUNTER — Other Ambulatory Visit: Payer: Self-pay | Admitting: Family Medicine

## 2017-03-07 ENCOUNTER — Encounter: Payer: Self-pay | Admitting: Family Medicine

## 2017-03-07 ENCOUNTER — Ambulatory Visit (INDEPENDENT_AMBULATORY_CARE_PROVIDER_SITE_OTHER): Payer: BLUE CROSS/BLUE SHIELD | Admitting: Family Medicine

## 2017-03-07 VITALS — BP 125/73 | HR 63 | Temp 98.3°F | Ht 63.75 in | Wt 185.5 lb

## 2017-03-07 DIAGNOSIS — L237 Allergic contact dermatitis due to plants, except food: Secondary | ICD-10-CM | POA: Diagnosis not present

## 2017-03-07 MED ORDER — DEXAMETHASONE SODIUM PHOSPHATE 10 MG/ML IJ SOLN
10.0000 mg | Freq: Once | INTRAMUSCULAR | Status: AC
  Administered 2017-03-07: 10 mg via INTRAMUSCULAR

## 2017-03-07 NOTE — Patient Instructions (Signed)
Poison Ivy Dermatitis Poison ivy dermatitis is inflammation of the skin that is caused by the allergens on the leaves of the poison ivy plant. The skin reaction often involves redness, swelling, blisters, and extreme itching. What are the causes? This condition is caused by a specific chemical (urushiol) found in the sap of the poison ivy plant. This chemical is sticky and can be easily spread to people, animals, and objects. You can get poison ivy dermatitis by:  Having direct contact with a poison ivy plant.  Touching animals, other people, or objects that have come in contact with poison ivy and have the chemical on them.  What increases the risk? This condition is more likely to develop in:  People who are outdoors often.  People who go outdoors without wearing protective clothing, such as closed shoes, long pants, and a long-sleeved shirt.  What are the signs or symptoms? Symptoms of this condition include:  Redness and itching.  A rash that often includes bumps and blisters. The rash usually appears 48 hours after exposure.  Swelling. This may occur if the reaction is more severe.  Symptoms usually last for 1-2 weeks. However, the first time you develop this condition, symptoms may last 3-4 weeks. How is this diagnosed? This condition may be diagnosed based on your symptoms and a physical exam. Your health care provider may also ask you about any recent outdoor activity. How is this treated? Treatment for this condition will vary depending on how severe it is. Treatment may include:  Hydrocortisone creams or calamine lotions to relieve itching.  Oatmeal baths to soothe the skin.  Over-the-counter antihistamine tablets.  Oral steroid medicine for more severe outbreaks.  Follow these instructions at home:  Take or apply over-the-counter and prescription medicines only as told by your health care provider.  Wash exposed skin as soon as possible with soap and cold  water.  Use hydrocortisone creams or calamine lotion as needed to soothe the skin and relieve itching.  Take oatmeal baths as needed. Use colloidal oatmeal. You can get this at your local pharmacy or grocery store. Follow the instructions on the packaging.  Do not scratch or rub your skin.  While you have the rash, wash clothes right after you wear them. How is this prevented?  Learn to identify the poison ivy plant and avoid contact with the plant. This plant can be recognized by the number of leaves. Generally, poison ivy has three leaves with flowering branches on a single stem. The leaves are typically glossy, and they have jagged edges that come to a point at the front.  If you have been exposed to poison ivy, thoroughly wash with soap and water right away. You have about 30 minutes to remove the plant resin before it will cause the rash. Be sure to wash under your fingernails because any plant resin there will continue to spread the rash.  When hiking or camping, wear clothes that will help you to avoid exposure on the skin. This includes long pants, a long-sleeved shirt, tall socks, and hiking boots. You can also apply preventive lotion to your skin to help limit exposure.  If you suspect that your clothes or outdoor gear came in contact with poison ivy, rinse them off outside with a garden hose before you bring them inside your house. Contact a health care provider if:  You have open sores in the rash area.  You have more redness, swelling, or pain in the affected area.  You have   redness that spreads beyond the rash area.  You have fluid, blood, or pus coming from the affected area.  You have a fever.  You have a rash over a large area of your body.  You have a rash on your eyes, mouth, or genitals.  Your rash does not improve after a few days. Get help right away if:  Your face swells or your eyes swell shut.  You have trouble breathing.  You have trouble  swallowing. This information is not intended to replace advice given to you by your health care provider. Make sure you discuss any questions you have with your health care provider. Document Released: 07/26/2000 Document Revised: 01/04/2016 Document Reviewed: 01/04/2015 Elsevier Interactive Patient Education  2018 [employer]  

## 2017-03-07 NOTE — Progress Notes (Signed)
   Subjective:    Patient ID: Kathy Byrd, female    DOB: 1965-11-03, 51 y.o.   MRN: [id number]  Rash  This is a new problem. The current episode started in the past 7 days. The affected locations include the face, neck, left arm and right arm. The rash is characterized by itchiness and blistering. She was exposed to plant contact. Pertinent negatives include no anorexia, cough, diarrhea, fatigue, fever or shortness of breath. Past treatments include antihistamine and anti-itch cream. The treatment provided mild relief.   Get yearly.. Steroid injection typically helps. Does not like SE of prednisone.    Review of Systems  Constitutional: Negative for fatigue and fever.  Respiratory: Negative for cough and shortness of breath.   Gastrointestinal: Negative for anorexia and diarrhea.  Skin: Positive for rash.       Objective:   Physical Exam  Constitutional: She appears well-developed.  HENT:  Head: Normocephalic.  Mouth/Throat: Oropharynx is clear and moist.  Eyes: Pupils are equal, round, and reactive to light.  Neck: Normal range of motion. Neck supple.  Cardiovascular: Normal rate and regular rhythm.  Exam reveals no friction rub.   No murmur heard. Pulmonary/Chest: Effort normal and breath sounds normal. No respiratory distress. She has no wheezes. She has no rales.  Skin:  Blistering linear rash on face, neck, arms          Assessment & Plan:

## 2017-03-07 NOTE — Addendum Note (Signed)
Addended by: Carter Kitten on: 03/07/2017 11:49 AM   Modules accepted: Orders

## 2017-05-16 ENCOUNTER — Other Ambulatory Visit: Payer: Self-pay | Admitting: Family Medicine

## 2017-05-16 DIAGNOSIS — Z1231 Encounter for screening mammogram for malignant neoplasm of breast: Secondary | ICD-10-CM

## 2017-06-17 ENCOUNTER — Ambulatory Visit
Admission: RE | Admit: 2017-06-17 | Discharge: 2017-06-17 | Disposition: A | Discharge status: Home / Self Care | Payer: BLUE CROSS/BLUE SHIELD | Source: Ambulatory Visit | Attending: Family Medicine | Admitting: Family Medicine

## 2017-06-17 DIAGNOSIS — Z1231 Encounter for screening mammogram for malignant neoplasm of breast: Secondary | ICD-10-CM

## 2017-07-15 DIAGNOSIS — M79672 Pain in left foot: Secondary | ICD-10-CM | POA: Diagnosis not present

## 2017-07-15 DIAGNOSIS — D2372 Other benign neoplasm of skin of left lower limb, including hip: Secondary | ICD-10-CM | POA: Diagnosis not present

## 2017-08-06 DIAGNOSIS — Z96661 Presence of right artificial ankle joint: Secondary | ICD-10-CM | POA: Diagnosis not present

## 2017-08-06 DIAGNOSIS — M19071 Primary osteoarthritis, right ankle and foot: Secondary | ICD-10-CM | POA: Diagnosis not present

## 2018-05-05 ENCOUNTER — Other Ambulatory Visit: Payer: Self-pay | Admitting: Family Medicine

## 2018-05-05 ENCOUNTER — Other Ambulatory Visit: Payer: Self-pay | Admitting: Primary Care

## 2018-05-05 DIAGNOSIS — Z1231 Encounter for screening mammogram for malignant neoplasm of breast: Secondary | ICD-10-CM

## 2018-05-26 ENCOUNTER — Encounter: Payer: Self-pay | Admitting: Primary Care

## 2018-05-26 ENCOUNTER — Ambulatory Visit: Payer: BLUE CROSS/BLUE SHIELD | Admitting: Primary Care

## 2018-05-26 VITALS — BP 116/78 | HR 58 | Temp 98.0°F | Ht 63.75 in | Wt 187.5 lb

## 2018-05-26 DIAGNOSIS — J302 Other seasonal allergic rhinitis: Secondary | ICD-10-CM

## 2018-05-26 DIAGNOSIS — Z23 Encounter for immunization: Secondary | ICD-10-CM

## 2018-05-26 DIAGNOSIS — Z0000 Encounter for general adult medical examination without abnormal findings: Secondary | ICD-10-CM | POA: Diagnosis not present

## 2018-05-26 LAB — COMPREHENSIVE METABOLIC PANEL
ALBUMIN: 4.6 g/dL (ref 3.5–5.2)
ALT: 13 U/L (ref 0–35)
AST: 16 U/L (ref 0–37)
Alkaline Phosphatase: 78 U/L (ref 39–117)
BILIRUBIN TOTAL: 0.6 mg/dL (ref 0.2–1.2)
BUN: 16 mg/dL (ref 6–23)
CO2: 32 mEq/L (ref 19–32)
Calcium: 9.7 mg/dL (ref 8.4–10.5)
Chloride: 102 mEq/L (ref 96–112)
Creatinine, Ser: 0.82 mg/dL (ref 0.40–1.20)
GFR: 77.12 mL/min (ref >60.00)
GLUCOSE: 96 mg/dL (ref 70–99)
Potassium: 4.5 mEq/L (ref 3.5–5.1)
Sodium: 139 mEq/L (ref 135–145)
Total Protein: 7.6 g/dL (ref 6.0–8.3)

## 2018-05-26 LAB — LIPID PANEL
CHOLESTEROL: 225 mg/dL — AB (ref 0–200)
HDL: 78.2 mg/dL (ref >39.00)
LDL CALC: 129 mg/dL — AB (ref 0–99)
NonHDL: 146.75
TRIGLYCERIDES: 90 mg/dL (ref 0.0–149.0)
Total CHOL/HDL Ratio: 3
VLDL: 18 mg/dL (ref 0.0–40.0)

## 2018-05-26 NOTE — Assessment & Plan Note (Signed)
Td due, provided today. Influenza vaccination provided today. Pap smear UTD, due in 2020. Colonoscopy UTD. Continue to work on diet, start regular exercise.  Exam unremarkable. Labs pending. Follow up in 1 year for CPE.

## 2018-05-26 NOTE — Assessment & Plan Note (Signed)
Chronic, typically during seasonal changes. Compliant to Claritin daily.

## 2018-05-26 NOTE — Progress Notes (Signed)
Subjective:    Patient ID: Kathy Byrd, female    DOB: 12-Jun-1966, 52 y.o.   MRN: [id number]  HPI  [name] is a 52 year old female who presents today to transfer care from Dr. Deborra Medina and for complete physical.    Immunizations: -Tetanus:Completed in 2008, due today -Influenza: Due  Diet: She endorses a healthy diet Breakfast: Eggs, fruit, vegetables  Lunch: Left overs, salad, occasional take out Dinner: Vegetables, meat, starch  Snacks: Chocolate, granola bar Desserts: 3-4 times weekly  Beverages: Coffee, water, occasional soda  Exercise: She is not exercising  Eye exam: Completed several years ago Dental exam: Completes semi-annually  Colonoscopy: Completed in 2017, due in 2020 Pap Smear: Completed in 2017, normal Mammogram: Scheduled for November     Review of Systems  Constitutional: Negative for unexpected weight change.  HENT: Negative for rhinorrhea.   Respiratory: Negative for cough and shortness of breath.   Cardiovascular: Negative for chest pain.  Gastrointestinal: Negative for constipation and diarrhea.  Genitourinary: Negative for difficulty urinating.  Musculoskeletal: Negative for arthralgias and myalgias.  Skin: Negative for rash.  Allergic/Immunologic: Negative for environmental allergies.  Neurological: Negative for dizziness, numbness and headaches.  Psychiatric/Behavioral: The patient is not nervous/anxious.        Past Medical History:  Diagnosis Date  . Seasonal allergies      Social History   Socioeconomic History  . Marital status: Single    Spouse name: Not on file  . Number of children: 0  . Years of education: Not on file  . Highest education level: Not on file  Occupational History    Employer: [employer]  . Financial resource strain: Not on file  . Food insecurity:    Worry: Not on file    Inability: Not on file  . Transportation needs:    Medical: Not on file    Non-medical: Not on file    Tobacco Use  . Smoking status: Never Smoker  . Smokeless tobacco: Never Used  Substance and Sexual Activity  . Alcohol use: Yes    Alcohol/week: 0.0 standard drinks    Comment: rarely  . Drug use: No  . Sexual activity: Not on file  Lifestyle  . Physical activity:    Days per week: Not on file    Minutes per session: Not on file  . Stress: Not on file  Relationships  . Social connections:    Talks on phone: Not on file    Gets together: Not on file    Attends religious service: Not on file    Active member of club or organization: Not on file    Attends meetings of clubs or organizations: Not on file    Relationship status: Not on file  . Intimate partner violence:    Fear of current or ex partner: Not on file    Emotionally abused: Not on file    Physically abused: Not on file    Forced sexual activity: Not on file  Other Topics Concern  . Not on file  Social History Narrative  . Not on file    Past Surgical History:  Procedure Laterality Date  . TOTAL ANKLE ARTHROPLASTY    . WISDOM TOOTH EXTRACTION     without anesthesia    Family History  Problem Relation Age of Onset  . Hyperlipidemia Father   . Hypertension Father   . Diabetes Maternal Grandmother   . Depression Sister   . Heart disease  Sister        cardiovascular syndrome, genetic  . Colon cancer Neg Hx   . Breast cancer Neg Hx     No Known Allergies  Current Outpatient Medications on File Prior to Visit  Medication Sig Dispense Refill  . loratadine (CLARITIN) 10 MG tablet Take 10 mg by mouth daily.     No current facility-administered medications on file prior to visit.     BP 116/78   Pulse (!) 58   Temp 98 F (36.7 C) (Oral)   Ht 5' 3.75" (1.619 m)   Wt 187 lb 8 oz (85 kg)   SpO2 98%   BMI 32.44 kg/m    Objective:   Physical Exam  Constitutional: She is oriented to person, place, and time. She appears well-nourished.  HENT:  Mouth/Throat: No oropharyngeal exudate.  Eyes: Pupils  are equal, round, and reactive to light. EOM are normal.  Neck: Neck supple. No thyromegaly present.  Cardiovascular: Normal rate and regular rhythm.  Respiratory: Effort normal and breath sounds normal.  GI: Soft. Bowel sounds are normal. There is no tenderness.  Musculoskeletal: Normal range of motion.  Neurological: She is alert and oriented to person, place, and time.  Skin: Skin is warm and dry.  Psychiatric: She has a normal mood and affect.           Assessment & Plan:

## 2018-05-26 NOTE — Patient Instructions (Signed)
Stop by the lab prior to leaving today. I will notify you of your results once received.   Start exercising. You should be getting 150 minutes of moderate intensity exercise weekly.  Continue to work on your diet, make sure to eat plenty of vegetables, fruit, whole grains, lean protein.  Ensure you are consuming 64 ounces of water daily.  You were provided with a tetanus vaccination which will cover you for 10 years.  We will see you in one year for your annual exam or sooner if needed.  It was a pleasure to see you today!   Preventive Care 40-64 Years, Female Preventive care refers to lifestyle choices and visits with your health care provider that can promote health and wellness. What does preventive care include?  A yearly physical exam. This is also called an annual well check.  Dental exams once or twice a year.  Routine eye exams. Ask your health care provider how often you should have your eyes checked.  Personal lifestyle choices, including: ? Daily care of your teeth and gums. ? Regular physical activity. ? Eating a healthy diet. ? Avoiding tobacco and drug use. ? Limiting alcohol use. ? Practicing safe sex. ? Taking low-dose aspirin daily starting at age 24. ? Taking vitamin and mineral supplements as recommended by your health care provider. What happens during an annual well check? The services and screenings done by your health care provider during your annual well check will depend on your age, overall health, lifestyle risk factors, and family history of disease. Counseling Your health care provider may ask you questions about your:  Alcohol use.  Tobacco use.  Drug use.  Emotional well-being.  Home and relationship well-being.  Sexual activity.  Eating habits.  Work and work Statistician.  Method of birth control.  Menstrual cycle.  Pregnancy history.  Screening You may have the following tests or measurements:  Height, weight, and  BMI.  Blood pressure.  Lipid and cholesterol levels. These may be checked every 5 years, or more frequently if you are over 73 years old.  Skin check.  Lung cancer screening. You may have this screening every year starting at age 14 if you have a 30-pack-year history of smoking and currently smoke or have quit within the past 15 years.  Fecal occult blood test (FOBT) of the stool. You may have this test every year starting at age 39.  Flexible sigmoidoscopy or colonoscopy. You may have a sigmoidoscopy every 5 years or a colonoscopy every 10 years starting at age 50.  Hepatitis C blood test.  Hepatitis B blood test.  Sexually transmitted disease (STD) testing.  Diabetes screening. This is done by checking your blood sugar (glucose) after you have not eaten for a while (fasting). You may have this done every 1-3 years.  Mammogram. This may be done every 1-2 years. Talk to your health care provider about when you should start having regular mammograms. This may depend on whether you have a family history of breast cancer.  BRCA-related cancer screening. This may be done if you have a family history of breast, ovarian, tubal, or peritoneal cancers.  Pelvic exam and Pap test. This may be done every 3 years starting at age 87. Starting at age 70, this may be done every 5 years if you have a Pap test in combination with an HPV test.  Bone density scan. This is done to screen for osteoporosis. You may have this scan if you are at high risk  for osteoporosis.  Discuss your test results, treatment options, and if necessary, the need for more tests with your health care provider. Vaccines Your health care provider may recommend certain vaccines, such as:  Influenza vaccine. This is recommended every year.  Tetanus, diphtheria, and acellular pertussis (Tdap, Td) vaccine. You may need a Td booster every 10 years.  Varicella vaccine. You may need this if you have not been vaccinated.  Zoster  vaccine. You may need this after age 50.  Measles, mumps, and rubella (MMR) vaccine. You may need at least one dose of MMR if you were born in 1957 or later. You may also need a second dose.  Pneumococcal 13-valent conjugate (PCV13) vaccine. You may need this if you have certain conditions and were not previously vaccinated.  Pneumococcal polysaccharide (PPSV23) vaccine. You may need one or two doses if you smoke cigarettes or if you have certain conditions.  Meningococcal vaccine. You may need this if you have certain conditions.  Hepatitis A vaccine. You may need this if you have certain conditions or if you travel or work in places where you may be exposed to hepatitis A.  Hepatitis B vaccine. You may need this if you have certain conditions or if you travel or work in places where you may be exposed to hepatitis B.  Haemophilus influenzae type b (Hib) vaccine. You may need this if you have certain conditions.  Talk to your health care provider about which screenings and vaccines you need and how often you need them. This information is not intended to replace advice given to you by your health care provider. Make sure you discuss any questions you have with your health care provider. Document Released: 08/25/2015 Document Revised: 04/17/2016 Document Reviewed: 05/30/2015 Elsevier Interactive Patient Education  [employer].

## 2018-05-26 NOTE — Addendum Note (Signed)
Addended by: Jacqualin Combes on: 05/26/2018 12:45 PM   Modules accepted: Orders

## 2018-05-27 ENCOUNTER — Encounter: Payer: Self-pay | Admitting: *Deleted

## 2018-06-19 ENCOUNTER — Ambulatory Visit
Admission: RE | Admit: 2018-06-19 | Discharge: 2018-06-19 | Disposition: A | Discharge status: Home / Self Care | Payer: BLUE CROSS/BLUE SHIELD | Source: Ambulatory Visit | Attending: Primary Care | Admitting: Primary Care

## 2018-06-19 DIAGNOSIS — Z1231 Encounter for screening mammogram for malignant neoplasm of breast: Secondary | ICD-10-CM | POA: Insufficient documentation

## 2019-01-26 ENCOUNTER — Encounter: Payer: Self-pay | Admitting: Internal Medicine

## 2019-03-25 ENCOUNTER — Other Ambulatory Visit: Payer: Self-pay

## 2019-03-25 ENCOUNTER — Ambulatory Visit: Payer: BC Managed Care – PPO | Admitting: *Deleted

## 2019-03-25 VITALS — Ht 65.0 in | Wt 180.0 lb

## 2019-03-25 DIAGNOSIS — Z86010 Personal history of colonic polyps: Secondary | ICD-10-CM

## 2019-03-25 MED ORDER — SUPREP BOWEL PREP KIT 17.5-3.13-1.6 GM/177ML PO SOLN: 1.0000 | Freq: Once | ORAL | 0 refills | Status: AC

## 2019-03-25 NOTE — Progress Notes (Signed)
No egg or soy allergy known to patient  No issues with past sedation with any surgeries  or procedures, no intubation problems - PONV after total ankle surgery  No diet pills per patient No home 02 use per patient  No blood thinners per patient  Pt denies issues with constipation  No A fib or A flutter  Pt unsure if a sister had colon polyps-   Pt verified name, DOB, address and insurance during PV today. Pt mailed instruction packet to included paper to complete and mail back to [facility] with addressed and stamped envelope, Emmi video, copy of consent form to read and not return, and instructions. [employer] $15  coupon mailed in packet. PV completed over the phone. Pt encouraged to call with questions or issues   Pt is aware that care partner will wait in the car during procedure; if they feel like they will be too hot to wait in the car; they may wait in the lobby.  We want them to wear a mask (we do not have any that we can provide them), practice social distancing, and we will check their temperatures when they get here.  I did remind patient that their care partner needs to stay in the parking lot the entire time. Pt will wear mask into building.

## 2019-04-05 ENCOUNTER — Encounter: Payer: Self-pay | Admitting: Internal Medicine

## 2019-04-07 ENCOUNTER — Telehealth: Payer: Self-pay | Admitting: *Deleted

## 2019-04-07 NOTE — Telephone Encounter (Signed)
Covid-19 screening questions   Do you now or have you had a fever in the last 14 days? no  Do you have any respiratory symptoms of shortness of breath or cough now or in the last 14 days? No  Do you have any family members or close contacts with diagnosed or suspected Covid-19 in the past 14 days? no  Have you been tested for Covid-19 and found to be positive?  no

## 2019-04-08 ENCOUNTER — Encounter: Payer: Self-pay | Admitting: Internal Medicine

## 2019-04-08 ENCOUNTER — Ambulatory Visit (AMBULATORY_SURGERY_CENTER): Payer: BC Managed Care – PPO | Admitting: Internal Medicine

## 2019-04-08 ENCOUNTER — Other Ambulatory Visit: Payer: Self-pay

## 2019-04-08 VITALS — BP 91/68 | HR 47 | Temp 98.3°F | Resp 12 | Ht 65.0 in | Wt 179.9 lb

## 2019-04-08 DIAGNOSIS — Z86010 Personal history of colonic polyps: Secondary | ICD-10-CM | POA: Diagnosis not present

## 2019-04-08 DIAGNOSIS — D125 Benign neoplasm of sigmoid colon: Secondary | ICD-10-CM

## 2019-04-08 DIAGNOSIS — K635 Polyp of colon: Secondary | ICD-10-CM | POA: Diagnosis not present

## 2019-04-08 DIAGNOSIS — Z1211 Encounter for screening for malignant neoplasm of colon: Secondary | ICD-10-CM | POA: Diagnosis not present

## 2019-04-08 MED ORDER — SODIUM CHLORIDE 0.9 % IV SOLN: 500.0000 mL | Freq: Once | INTRAVENOUS | Status: DC

## 2019-04-08 NOTE — Progress Notes (Signed)
Report given to PACU, vss 

## 2019-04-08 NOTE — Op Note (Signed)
[facility] Patient Name: Kathy Byrd Procedure Date: 04/08/2019 11:19 AM MRN: [id number] Endoscopist: Jerene Bears , MD Age: 53 Referring MD:  Date of Birth: 01/23/1966 Gender: Female Account #: [id number] Procedure:                Colonoscopy Indications:              High risk colon cancer surveillance: Personal                            history of multiple (3 or more) adenomas, Last                            colonoscopy 3 years ago Medicines:                Monitored Anesthesia Care Procedure:                Pre-Anesthesia Assessment:                           - Prior to the procedure, a History and Physical                            was performed, and patient medications and                            allergies were reviewed. The patient's tolerance of                            previous anesthesia was also reviewed. The risks                            and benefits of the procedure and the sedation                            options and risks were discussed with the patient.                            All questions were answered, and informed consent                            was obtained. Prior Anticoagulants: The patient has                            taken no previous anticoagulant or antiplatelet                            agents. ASA Grade Assessment: II - A patient with                            mild systemic disease. After reviewing the risks                            and benefits, the patient was deemed in  satisfactory condition to undergo the procedure.                           After obtaining informed consent, the colonoscope                            was passed under direct vision. Throughout the                            procedure, the patient's blood pressure, pulse, and                            oxygen saturations were monitored continuously. The                            Colonoscope was introduced through the anus  and                            advanced to the cecum, identified by appendiceal                            orifice and ileocecal valve. The colonoscopy was                            performed without difficulty. The patient tolerated                            the procedure well. The quality of the bowel                            preparation was excellent. The ileocecal valve,                            appendiceal orifice, and rectum were photographed. Scope In: 11:20:12 AM Scope Out: 11:33:46 AM Scope Withdrawal Time: 0 hours 10 minutes 21 seconds  Total Procedure Duration: 0 hours 13 minutes 34 seconds  Findings:                 The digital rectal exam was normal.                           A 4 mm polyp was found in the sigmoid colon. The                            polyp was sessile. The polyp was removed with a                            cold snare. Resection and retrieval were complete.                           Internal hemorrhoids were found during                            retroflexion. The hemorrhoids were small.  The exam was otherwise without abnormality. Complications:            No immediate complications. Estimated Blood Loss:     Estimated blood loss was minimal. Impression:               - One 4 mm polyp in the sigmoid colon, removed with                            a cold snare. Resected and retrieved.                           - Small internal hemorrhoids.                           - The examination was otherwise normal. Recommendation:           - Patient has a contact number available for                            emergencies. The signs and symptoms of potential                            delayed complications were discussed with the                            patient. Return to normal activities tomorrow.                            Written discharge instructions were provided to the                            patient.                            - Resume previous diet.                           - Continue present medications.                           - Await pathology results.                           - Repeat colonoscopy in 5 years for surveillance. Jerene Bears, MD 04/08/2019 11:38:03 AM This report has been signed electronically.

## 2019-04-08 NOTE — Progress Notes (Signed)
Pt's states no medical or surgical changes since previsit or office visit.  Temp CW Vitals [place]

## 2019-04-08 NOTE — Patient Instructions (Signed)
Handouts: Polyps and Hemorrhoids   YOU HAD AN ENDOSCOPIC PROCEDURE TODAY AT [facility]:   Refer to the procedure report that was given to you for any specific questions about what was found during the examination.  If the procedure report does not answer your questions, please call your gastroenterologist to clarify.  If you requested that your care partner not be given the details of your procedure findings, then the procedure report has been included in a sealed envelope for you to review at your convenience later.  YOU SHOULD EXPECT: Some feelings of bloating in the abdomen. Passage of more gas than usual.  Walking can help get rid of the air that was put into your GI tract during the procedure and reduce the bloating. If you had a lower endoscopy (such as a colonoscopy or flexible sigmoidoscopy) you may notice spotting of blood in your stool or on the toilet paper. If you underwent a bowel prep for your procedure, you may not have a normal bowel movement for a few days.  Please Note:  You might notice some irritation and congestion in your nose or some drainage.  This is from the oxygen used during your procedure.  There is no need for concern and it should clear up in a day or so.  SYMPTOMS TO REPORT IMMEDIATELY:   Following lower endoscopy (colonoscopy or flexible sigmoidoscopy):  Excessive amounts of blood in the stool  Significant tenderness or worsening of abdominal pains  Swelling of the abdomen that is new, acute  Fever of 100F or higher   For urgent or emergent issues, a gastroenterologist can be reached at any hour by calling [phone].   DIET:  We do recommend a small meal at first, but then you may proceed to your regular diet.  Drink plenty of fluids but you should avoid alcoholic beverages for 24 hours.  ACTIVITY:  You should plan to take it easy for the rest of today and you should NOT DRIVE or use heavy machinery until tomorrow (because of the  sedation medicines used during the test).    FOLLOW UP: Our staff will call the number listed on your records 48-72 hours following your procedure to check on you and address any questions or concerns that you may have regarding the information given to you following your procedure. If we do not reach you, we will leave a message.  We will attempt to reach you two times.  During this call, we will ask if you have developed any symptoms of COVID 19. If you develop any symptoms (ie: fever, flu-like symptoms, shortness of breath, cough etc.) before then, please call [phone].  If you test positive for Covid 19 in the 2 weeks post procedure, please call and report this information to [place].    If any biopsies were taken you will be contacted by phone or by letter within the next 1-3 weeks.  Please call us at [phone] if you have not heard about the biopsies in 3 weeks.    SIGNATURES/CONFIDENTIALITY: You and/or your care partner have signed paperwork which will be entered into your electronic medical record.  These signatures attest to the fact that that the information above on your After Visit Summary has been reviewed and is understood.  Full responsibility of the confidentiality of this discharge information lies with you and/or your care-partner.

## 2019-04-12 ENCOUNTER — Telehealth: Payer: Self-pay | Admitting: *Deleted

## 2019-04-12 NOTE — Telephone Encounter (Signed)
No answer for second post procedure follow up call. Left message for patient to call back with questions or concerns. SM

## 2019-04-12 NOTE — Telephone Encounter (Signed)
Left message for f/u call

## 2019-04-22 ENCOUNTER — Encounter: Payer: Self-pay | Admitting: Internal Medicine

## 2019-05-20 ENCOUNTER — Ambulatory Visit: Payer: BC Managed Care – PPO

## 2019-05-20 ENCOUNTER — Ambulatory Visit (INDEPENDENT_AMBULATORY_CARE_PROVIDER_SITE_OTHER): Payer: BC Managed Care – PPO

## 2019-05-20 DIAGNOSIS — Z23 Encounter for immunization: Secondary | ICD-10-CM

## 2019-06-08 ENCOUNTER — Telehealth: Payer: Self-pay | Admitting: Primary Care

## 2019-06-08 DIAGNOSIS — Z1231 Encounter for screening mammogram for malignant neoplasm of breast: Secondary | ICD-10-CM

## 2019-06-08 NOTE — Telephone Encounter (Signed)
Please notify patient that I will order her mammogram. Please also notify her that we encourage an annual CPE every year and schedule if she's willing.

## 2019-06-08 NOTE — Telephone Encounter (Signed)
Last OV on 05/26/2018. Please advise.

## 2019-06-08 NOTE — Telephone Encounter (Signed)
Pt called to schedule her screening mammogram @ [facility]  They need order since it has been over a year since pt saw you

## 2019-06-30 ENCOUNTER — Ambulatory Visit
Admission: RE | Admit: 2019-06-30 | Discharge: 2019-06-30 | Disposition: A | Discharge status: Home / Self Care | Payer: BC Managed Care – PPO | Source: Ambulatory Visit | Attending: Primary Care | Admitting: Primary Care

## 2019-06-30 DIAGNOSIS — Z1231 Encounter for screening mammogram for malignant neoplasm of breast: Secondary | ICD-10-CM | POA: Diagnosis not present

## 2019-08-10 ENCOUNTER — Other Ambulatory Visit: Payer: BC Managed Care – PPO

## 2019-08-11 ENCOUNTER — Other Ambulatory Visit: Payer: BC Managed Care – PPO

## 2019-08-17 ENCOUNTER — Encounter: Payer: BC Managed Care – PPO | Admitting: Primary Care

## 2019-09-15 ENCOUNTER — Encounter: Payer: BC Managed Care – PPO | Admitting: Primary Care

## 2019-10-22 ENCOUNTER — Encounter: Payer: BC Managed Care – PPO | Admitting: Primary Care

## 2019-11-11 ENCOUNTER — Encounter: Payer: BC Managed Care – PPO | Admitting: Primary Care

## 2019-11-22 ENCOUNTER — Encounter: Payer: BC Managed Care – PPO | Admitting: Primary Care

## 2019-11-29 ENCOUNTER — Encounter: Payer: BC Managed Care – PPO | Admitting: Primary Care

## 2019-12-07 ENCOUNTER — Encounter: Payer: Self-pay | Admitting: Primary Care

## 2019-12-13 ENCOUNTER — Encounter: Payer: Self-pay | Admitting: Primary Care

## 2020-01-04 ENCOUNTER — Encounter: Payer: Self-pay | Admitting: Primary Care

## 2020-01-27 ENCOUNTER — Encounter: Payer: Self-pay | Admitting: Primary Care

## 2020-02-01 ENCOUNTER — Encounter: Payer: Self-pay | Admitting: Primary Care

## 2020-02-15 ENCOUNTER — Encounter: Payer: Self-pay | Admitting: Primary Care

## 2020-02-17 ENCOUNTER — Encounter: Payer: Self-pay | Admitting: Primary Care

## 2020-03-01 ENCOUNTER — Encounter: Payer: Self-pay | Admitting: Primary Care

## 2020-03-01 ENCOUNTER — Other Ambulatory Visit (HOSPITAL_COMMUNITY)
Admission: RE | Admit: 2020-03-01 | Discharge: 2020-03-01 | Disposition: A | Discharge status: Home / Self Care | Payer: No Typology Code available for payment source | Source: Ambulatory Visit | Attending: Primary Care | Admitting: Primary Care

## 2020-03-01 ENCOUNTER — Ambulatory Visit (INDEPENDENT_AMBULATORY_CARE_PROVIDER_SITE_OTHER): Payer: No Typology Code available for payment source | Admitting: Primary Care

## 2020-03-01 ENCOUNTER — Other Ambulatory Visit: Payer: Self-pay

## 2020-03-01 VITALS — BP 136/84 | HR 64 | Temp 96.2°F | Ht 65.0 in | Wt 196.2 lb

## 2020-03-01 DIAGNOSIS — Z23 Encounter for immunization: Secondary | ICD-10-CM

## 2020-03-01 DIAGNOSIS — Z0000 Encounter for general adult medical examination without abnormal findings: Secondary | ICD-10-CM | POA: Diagnosis not present

## 2020-03-01 DIAGNOSIS — Z124 Encounter for screening for malignant neoplasm of cervix: Secondary | ICD-10-CM

## 2020-03-01 DIAGNOSIS — Z1159 Encounter for screening for other viral diseases: Secondary | ICD-10-CM

## 2020-03-01 DIAGNOSIS — Z114 Encounter for screening for human immunodeficiency virus [HIV]: Secondary | ICD-10-CM | POA: Diagnosis not present

## 2020-03-01 DIAGNOSIS — D229 Melanocytic nevi, unspecified: Secondary | ICD-10-CM

## 2020-03-01 LAB — COMPREHENSIVE METABOLIC PANEL
ALT: 17 U/L (ref 0–35)
AST: 19 U/L (ref 0–37)
Albumin: 4.4 g/dL (ref 3.5–5.2)
Alkaline Phosphatase: 76 U/L (ref 39–117)
BUN: 13 mg/dL (ref 6–23)
CO2: 31 mEq/L (ref 19–32)
Calcium: 9.7 mg/dL (ref 8.4–10.5)
Chloride: 105 mEq/L (ref 96–112)
Creatinine, Ser: 0.93 mg/dL (ref 0.40–1.20)
GFR: 62.34 mL/min (ref >60.00)
Glucose, Bld: 101 mg/dL — ABNORMAL HIGH (ref 70–99)
Potassium: 5.2 mEq/L — ABNORMAL HIGH (ref 3.5–5.1)
Sodium: 141 mEq/L (ref 135–145)
Total Bilirubin: 0.7 mg/dL (ref 0.2–1.2)
Total Protein: 7.5 g/dL (ref 6.0–8.3)

## 2020-03-01 LAB — LIPID PANEL
Cholesterol: 213 mg/dL — ABNORMAL HIGH (ref 0–200)
HDL: 80.2 mg/dL (ref >39.00)
LDL Cholesterol: 116 mg/dL — ABNORMAL HIGH (ref 0–99)
NonHDL: 132.83
Total CHOL/HDL Ratio: 3
Triglycerides: 86 mg/dL (ref 0.0–149.0)
VLDL: 17.2 mg/dL (ref 0.0–40.0)

## 2020-03-01 LAB — CBC
HCT: 40.1 % (ref 36.0–46.0)
Hemoglobin: 13.9 g/dL (ref 12.0–15.0)
MCHC: 34.6 g/dL (ref 30.0–36.0)
MCV: 86.5 fl (ref 78.0–100.0)
Platelets: 203 10*3/uL (ref 150.0–400.0)
RBC: 4.64 Mil/uL (ref 3.87–5.11)
RDW: 12.9 % (ref 11.5–15.5)
WBC: 4.4 10*3/uL (ref 4.0–10.5)

## 2020-03-01 LAB — HEMOGLOBIN A1C: Hgb A1c MFr Bld: 5.5 % (ref 4.6–6.5)

## 2020-03-01 NOTE — Patient Instructions (Signed)
You will be contacted regarding your referral to dermatology.  Please let us know if you have not been contacted within two weeks.   Stop by the lab prior to leaving today. I will notify you of your results once received.   Start exercising. You should be getting 150 minutes of moderate intensity exercise weekly.  It's important to improve your diet by reducing consumption of fast food, fried food, processed snack foods, sugary drinks. Increase consumption of fresh vegetables and fruits, whole grains, water.  Ensure you are drinking 64 ounces of water daily.  Schedule a nurse visit for 2-6 months for your second Shingrix vaccine.  It was a pleasure to see you today!   Preventive Care 80-56 Years Old, Female Preventive care refers to visits with your health care provider and lifestyle choices that can promote health and wellness. This includes:  A yearly physical exam. This may also be called an annual well check.  Regular dental visits and eye exams.  Immunizations.  Screening for certain conditions.  Healthy lifestyle choices, such as eating a healthy diet, getting regular exercise, not using drugs or products that contain nicotine and tobacco, and limiting alcohol use. What can I expect for my preventive care visit? Physical exam Your health care provider will check your:  Height and weight. This may be used to calculate body mass index (BMI), which tells if you are at a healthy weight.  Heart rate and blood pressure.  Skin for abnormal spots. Counseling Your health care provider may ask you questions about your:  Alcohol, tobacco, and drug use.  Emotional well-being.  Home and relationship well-being.  Sexual activity.  Eating habits.  Work and work Statistician.  Method of birth control.  Menstrual cycle.  Pregnancy history. What immunizations do I need?  Influenza (flu) vaccine  This is recommended every year. Tetanus, diphtheria, and pertussis (Tdap)  vaccine  You may need a Td booster every 10 years. Varicella (chickenpox) vaccine  You may need this if you have not been vaccinated. Zoster (shingles) vaccine  You may need this after age 45. Measles, mumps, and rubella (MMR) vaccine  You may need at least one dose of MMR if you were born in 1957 or later. You may also need a second dose. Pneumococcal conjugate (PCV13) vaccine  You may need this if you have certain conditions and were not previously vaccinated. Pneumococcal polysaccharide (PPSV23) vaccine  You may need one or two doses if you smoke cigarettes or if you have certain conditions. Meningococcal conjugate (MenACWY) vaccine  You may need this if you have certain conditions. Hepatitis A vaccine  You may need this if you have certain conditions or if you travel or work in places where you may be exposed to hepatitis A. Hepatitis B vaccine  You may need this if you have certain conditions or if you travel or work in places where you may be exposed to hepatitis B. Haemophilus influenzae type b (Hib) vaccine  You may need this if you have certain conditions. Human papillomavirus (HPV) vaccine  If recommended by your health care provider, you may need three doses over 6 months. You may receive vaccines as individual doses or as more than one vaccine together in one shot (combination vaccines). Talk with your health care provider about the risks and benefits of combination vaccines. What tests do I need? Blood tests  Lipid and cholesterol levels. These may be checked every 5 years, or more frequently if you are over 50 years  old.  Hepatitis C test.  Hepatitis B test. Screening  Lung cancer screening. You may have this screening every year starting at age 108 if you have a 30-pack-year history of smoking and currently smoke or have quit within the past 15 years.  Colorectal cancer screening. All adults should have this screening starting at age 73 and continuing until  age 84. Your health care provider may recommend screening at age 43 if you are at increased risk. You will have tests every 1-10 years, depending on your results and the type of screening test.  Diabetes screening. This is done by checking your blood sugar (glucose) after you have not eaten for a while (fasting). You may have this done every 1-3 years.  Mammogram. This may be done every 1-2 years. Talk with your health care provider about when you should start having regular mammograms. This may depend on whether you have a family history of breast cancer.  BRCA-related cancer screening. This may be done if you have a family history of breast, ovarian, tubal, or peritoneal cancers.  Pelvic exam and Pap test. This may be done every 3 years starting at age 85. Starting at age 44, this may be done every 5 years if you have a Pap test in combination with an HPV test. Other tests  Sexually transmitted disease (STD) testing.  Bone density scan. This is done to screen for osteoporosis. You may have this scan if you are at high risk for osteoporosis. Follow these instructions at home: Eating and drinking  Eat a diet that includes fresh fruits and vegetables, whole grains, lean protein, and low-fat dairy.  Take vitamin and mineral supplements as recommended by your health care provider.  Do not drink alcohol if: ? Your health care provider tells you not to drink. ? You are pregnant, may be pregnant, or are planning to become pregnant.  If you drink alcohol: ? Limit how much you have to 0-1 drink a day. ? Be aware of how much alcohol is in your drink. In the [place] one drink equals one 12 oz bottle of beer (355 mL), one 5 oz glass of wine (148 mL), or one 1 oz glass of hard liquor (44 mL). Lifestyle  Take daily care of your teeth and gums.  Stay active. Exercise for at least 30 minutes on 5 or more days each week.  Do not use any products that contain nicotine or tobacco, such as cigarettes,  e-cigarettes, and chewing tobacco. If you need help quitting, ask your health care provider.  If you are sexually active, practice safe sex. Use a condom or other form of birth control (contraception) in order to prevent pregnancy and STIs (sexually transmitted infections).  If told by your health care provider, take low-dose aspirin daily starting at age 40. What's next?  Visit your health care provider once a year for a well check visit.  Ask your health care provider how often you should have your eyes and teeth checked.  Stay up to date on all vaccines. This information is not intended to replace advice given to you by your health care provider. Make sure you discuss any questions you have with your health care provider. Document Revised: 04/09/2018 Document Reviewed: 04/09/2018 Elsevier Patient Education  2020 [employer].

## 2020-03-01 NOTE — Assessment & Plan Note (Signed)
Tetanus UTD, due for Shingrix Mammogram UTD. Pap smear due, completed and pending. Discussed the importance of a healthy diet and regular exercise in order for weight loss, and to reduce the risk of any potential medical problems. Exam today unremarkable. Labs pending.

## 2020-03-01 NOTE — Progress Notes (Signed)
Subjective:    Patient ID: Kathy Byrd, female    DOB: 03/12/1966, 54 y.o.   MRN: [id number]  HPI  This visit occurred during the SARS-CoV-2 public health emergency.  Safety protocols were in place, including screening questions prior to the visit, additional usage of staff PPE, and extensive cleaning of exam room while observing appropriate contact time as indicated for disinfecting solutions.   Kathy Byrd is a 54 year old female who presents today for complete physical. She would also like a referral to dermatology for a few nevi.   Immunizations: -Tetanus: Completed in 2019 -Influenza: Due this season  -Shingles: Never completed  -Covid-19: Completed series   Diet: She endorses a fair diet.  Exercise: No regular exercise   Eye exam: Completed in 2021 Dental exam: Completes semi-annually   Pap Smear: Completed in 2017, due Mammogram: Completed in November 2020 Colonoscopy: Completed in 2020, due in 2025 Hep C Screen: Due  BP Readings from Last 3 Encounters:  03/01/20 136/84  04/08/19 91/68  05/26/18 116/78     Review of Systems  Constitutional: Negative for unexpected weight change.  HENT: Negative for rhinorrhea.   Eyes: Negative for visual disturbance.  Respiratory: Negative for cough and shortness of breath.   Cardiovascular: Negative for chest pain.  Gastrointestinal: Negative for constipation and diarrhea.  Genitourinary: Negative for difficulty urinating.  Musculoskeletal: Negative for arthralgias and myalgias.  Skin: Negative for rash.  Allergic/Immunologic: Positive for environmental allergies.  Neurological: Negative for dizziness, numbness and headaches.  Psychiatric/Behavioral: The patient is not nervous/anxious.        Past Medical History:  Diagnosis Date  . Allergy   . PONV (postoperative nausea and vomiting)   . Seasonal allergies      Social History   Socioeconomic History  . Marital status: Single    Spouse name: Not on file  .  Number of children: 0  . Years of education: Not on file  . Highest education level: Not on file  Occupational History    Employer: Manufacturing engineer  Tobacco Use  . Smoking status: Never Smoker  . Smokeless tobacco: Never Used  Vaping Use  . Vaping Use: Never used  Substance and Sexual Activity  . Alcohol use: Yes    Alcohol/week: 0.0 standard drinks    Comment: rarely  . Drug use: No  . Sexual activity: Not on file  Other Topics Concern  . Not on file  Social History Narrative  . Not on file   Social Determinants of Health   Financial Resource Strain:   . Difficulty of Paying Living Expenses:   Food Insecurity:   . Worried About Charity fundraiser in the Last Year:   . Arboriculturist in the Last Year:   Transportation Needs:   . Film/video editor (Medical):   Marland Kitchen Lack of Transportation (Non-Medical):   Physical Activity:   . Days of Exercise per Week:   . Minutes of Exercise per Session:   Stress:   . Feeling of Stress :   Social Connections:   . Frequency of Communication with Friends and Family:   . Frequency of Social Gatherings with Friends and Family:   . Attends Religious Services:   . Active Member of Clubs or Organizations:   . Attends Archivist Meetings:   Marland Kitchen Marital Status:   Intimate Partner Violence:   . Fear of Current or Ex-Partner:   . Emotionally Abused:   Marland Kitchen Physically Abused:   .  Sexually Abused:     Past Surgical History:  Procedure Laterality Date  . COLONOSCOPY    . POLYPECTOMY    . TOTAL ANKLE ARTHROPLASTY    . WISDOM TOOTH EXTRACTION     without anesthesia    Family History  Problem Relation Age of Onset  . Hyperlipidemia Father   . Hypertension Father   . Diabetes Maternal Grandmother   . Depression Sister   . Heart disease Sister        cardiovascular syndrome, genetic  . Colon cancer Neg Hx   . Breast cancer Neg Hx   . Colon polyps Neg Hx   . Esophageal cancer Neg Hx   . Rectal cancer Neg Hx   . Stomach  cancer Neg Hx     No Known Allergies  Current Outpatient Medications on File Prior to Visit  Medication Sig Dispense Refill  . loratadine (CLARITIN) 10 MG tablet Take 10 mg by mouth daily.    . Omega-3 Fatty Acids (FISH OIL) 1200 MG CAPS Take by mouth.     No current facility-administered medications on file prior to visit.    BP 136/84   Pulse 64   Temp (!) 96.2 F (35.7 C) (Temporal)   Ht 5\' 5"  (1.651 m)   Wt 196 lb 4 oz (89 kg)   SpO2 98%   BMI 32.66 kg/m    Objective:   Physical Exam HENT:     Right Ear: Tympanic membrane and ear canal normal.     Left Ear: Tympanic membrane and ear canal normal.  Eyes:     Pupils: Pupils are equal, round, and reactive to light.  Cardiovascular:     Rate and Rhythm: Normal rate and regular rhythm.  Pulmonary:     Effort: Pulmonary effort is normal.     Breath sounds: Normal breath sounds.  Abdominal:     General: Bowel sounds are normal.     Palpations: Abdomen is soft.     Tenderness: There is no abdominal tenderness.  Genitourinary:    Labia:        Right: No tenderness or lesion.        Left: No tenderness or lesion.      Cervix: No cervical motion tenderness or discharge.     Uterus: Normal.      Adnexa: Right adnexa normal and left adnexa normal.  Musculoskeletal:        General: Normal range of motion.     Cervical back: Neck supple.  Skin:    General: Skin is warm and dry.     Comments: Benign appearing, light brown, circular nevus to right breast in areola   Neurological:     Mental Status: She is alert and oriented to person, place, and time.     Cranial Nerves: No cranial nerve deficit.     Deep Tendon Reflexes:     Reflex Scores:      Patellar reflexes are 2+ on the right side and 2+ on the left side. Psychiatric:        Mood and Affect: Mood normal.            Assessment & Plan:

## 2020-03-01 NOTE — Addendum Note (Signed)
Addended by: Jacqualin Combes on: 03/01/2020 09:07 AM   Modules accepted: Orders

## 2020-03-02 LAB — CYTOLOGY - PAP
Comment: NEGATIVE
Diagnosis: NEGATIVE
High risk HPV: NEGATIVE

## 2020-03-02 LAB — HIV ANTIBODY (ROUTINE TESTING W REFLEX): HIV 1&2 Ab, 4th Generation: NONREACTIVE

## 2020-03-02 LAB — HEPATITIS C ANTIBODY
Hepatitis C Ab: NONREACTIVE
SIGNAL TO CUT-OFF: 0.01 (ref <1.00)

## 2020-05-23 ENCOUNTER — Ambulatory Visit (INDEPENDENT_AMBULATORY_CARE_PROVIDER_SITE_OTHER): Payer: No Typology Code available for payment source

## 2020-05-23 DIAGNOSIS — Z23 Encounter for immunization: Secondary | ICD-10-CM

## 2020-05-29 ENCOUNTER — Other Ambulatory Visit: Payer: Self-pay | Admitting: Primary Care

## 2020-05-29 DIAGNOSIS — Z1231 Encounter for screening mammogram for malignant neoplasm of breast: Secondary | ICD-10-CM

## 2020-06-21 ENCOUNTER — Ambulatory Visit (INDEPENDENT_AMBULATORY_CARE_PROVIDER_SITE_OTHER): Payer: No Typology Code available for payment source

## 2020-06-21 DIAGNOSIS — Z23 Encounter for immunization: Secondary | ICD-10-CM

## 2020-06-29 ENCOUNTER — Ambulatory Visit: Payer: No Typology Code available for payment source | Admitting: Dermatology

## 2020-06-30 ENCOUNTER — Other Ambulatory Visit: Payer: Self-pay

## 2020-06-30 ENCOUNTER — Ambulatory Visit
Admission: RE | Admit: 2020-06-30 | Discharge: 2020-06-30 | Disposition: A | Discharge status: Home / Self Care | Payer: No Typology Code available for payment source | Source: Ambulatory Visit | Attending: Primary Care | Admitting: Primary Care

## 2020-06-30 DIAGNOSIS — Z1231 Encounter for screening mammogram for malignant neoplasm of breast: Secondary | ICD-10-CM | POA: Insufficient documentation

## 2020-07-04 ENCOUNTER — Other Ambulatory Visit: Payer: Self-pay | Admitting: Primary Care

## 2020-07-04 DIAGNOSIS — R928 Other abnormal and inconclusive findings on diagnostic imaging of breast: Secondary | ICD-10-CM

## 2020-07-04 DIAGNOSIS — N6489 Other specified disorders of breast: Secondary | ICD-10-CM

## 2020-07-12 ENCOUNTER — Ambulatory Visit
Admission: RE | Admit: 2020-07-12 | Discharge: 2020-07-12 | Disposition: A | Discharge status: Home / Self Care | Payer: No Typology Code available for payment source | Source: Ambulatory Visit | Attending: Primary Care | Admitting: Primary Care

## 2020-07-12 ENCOUNTER — Other Ambulatory Visit: Payer: Self-pay

## 2020-07-12 DIAGNOSIS — N6489 Other specified disorders of breast: Secondary | ICD-10-CM

## 2020-07-12 DIAGNOSIS — R928 Other abnormal and inconclusive findings on diagnostic imaging of breast: Secondary | ICD-10-CM

## 2020-11-15 ENCOUNTER — Encounter: Payer: Self-pay | Admitting: Dermatology

## 2020-11-15 ENCOUNTER — Other Ambulatory Visit: Payer: Self-pay

## 2020-11-15 ENCOUNTER — Ambulatory Visit: Payer: No Typology Code available for payment source | Admitting: Dermatology

## 2020-11-15 DIAGNOSIS — H0264 Xanthelasma of left upper eyelid: Secondary | ICD-10-CM | POA: Diagnosis not present

## 2020-11-15 DIAGNOSIS — L820 Inflamed seborrheic keratosis: Secondary | ICD-10-CM

## 2020-11-15 DIAGNOSIS — L821 Other seborrheic keratosis: Secondary | ICD-10-CM

## 2020-11-15 DIAGNOSIS — H0260 Xanthelasma of unspecified eye, unspecified eyelid: Secondary | ICD-10-CM

## 2020-11-15 DIAGNOSIS — D1801 Hemangioma of skin and subcutaneous tissue: Secondary | ICD-10-CM

## 2020-11-15 DIAGNOSIS — L578 Other skin changes due to chronic exposure to nonionizing radiation: Secondary | ICD-10-CM | POA: Diagnosis not present

## 2020-11-15 NOTE — Progress Notes (Signed)
   New Patient Visit  Subjective  Kathy Byrd is a 55 y.o. female who presents for the following: Other (Spots of right cheek, right arm and right breast that she just wants checked.).  The following portions of the chart were reviewed this encounter and updated as appropriate:   Tobacco  Allergies  Meds  Problems  Med Hx  Surg Hx  Fam Hx     Review of Systems:  No other skin or systemic complaints except as noted in HPI or Assessment and Plan.  Objective  Well appearing patient in no apparent distress; mood and affect are within normal limits.  A focused examination was performed including face, right arm, right breast. Relevant physical exam findings are noted in the Assessment and Plan.  Objective  Right cheek infraorbital x 1, right temple x 2, left cheek infraorbital x 1, left inf cheek x 1, right inframammary x 1, right breast aerola x 1 (7): Erythematous keratotic or waxy stuck-on papule or plaque.   Objective  Right Antecubital Fossa: Red papule  Objective  left upper medail eyelid: Yellow plaque   Assessment & Plan    Actinic Damage - chronic, secondary to cumulative UV radiation exposure/sun exposure over time - diffuse scaly erythematous macules with underlying dyspigmentation - Recommend daily broad spectrum sunscreen SPF 30+ to sun-exposed areas, reapply every 2 hours as needed.  - Recommend staying in the shade or wearing long sleeves, sun glasses (UVA+UVB protection) and wide brim hats (4-inch brim around the entire circumference of the hat). - Call for new or changing lesions.  Seborrheic Keratoses - Stuck-on, waxy, tan-brown papules and/or plaques  - Benign-appearing - Discussed benign etiology and prognosis. - Observe - Call for any changes  Inflamed seborrheic keratosis (7) Right cheek infraorbital x 1, right temple x 2, left cheek infraorbital x 1, left inf cheek x 1, right inframammary x 1, right breast aerola x 1 Destruction of lesion -  Right cheek infraorbital x 1, right temple x 2, left cheek infraorbital x 1, left inf cheek x 1, right inframammary x 1, right breast aerola x 1 Complexity: simple   Destruction method: cryotherapy   Informed consent: discussed and consent obtained   Timeout:  patient name, date of birth, surgical site, and procedure verified Lesion destroyed using liquid nitrogen: Yes   Region frozen until ice ball extended beyond lesion: Yes   Outcome: patient tolerated procedure well with no complications   Post-procedure details: wound care instructions given    Hemangioma of skin Right Antecubital Fossa Benign appearing. Observe.  Xanthelasma left upper medail eyelid Benign appearing. Observe. Advised patient, if cholesterol has not been checked lately, she should have that done. Recommend discussing removal with her eye doctor.  Return in about 4 months (around 03/17/2021) for Follow up.  I, Ashok Cordia, CMA, am acting as scribe for Sarina Ser, MD .  Documentation: I have reviewed the above documentation for accuracy and completeness, and I agree with the above.  Sarina Ser, MD

## 2020-11-15 NOTE — Patient Instructions (Signed)

## 2021-03-19 ENCOUNTER — Ambulatory Visit: Payer: No Typology Code available for payment source | Admitting: Dermatology

## 2021-03-26 ENCOUNTER — Other Ambulatory Visit: Payer: Self-pay

## 2021-03-26 ENCOUNTER — Ambulatory Visit: Payer: No Typology Code available for payment source | Admitting: Dermatology

## 2021-03-26 DIAGNOSIS — L821 Other seborrheic keratosis: Secondary | ICD-10-CM | POA: Diagnosis not present

## 2021-03-26 DIAGNOSIS — L820 Inflamed seborrheic keratosis: Secondary | ICD-10-CM | POA: Diagnosis not present

## 2021-03-26 DIAGNOSIS — B078 Other viral warts: Secondary | ICD-10-CM

## 2021-03-26 DIAGNOSIS — D485 Neoplasm of uncertain behavior of skin: Secondary | ICD-10-CM

## 2021-03-26 DIAGNOSIS — L578 Other skin changes due to chronic exposure to nonionizing radiation: Secondary | ICD-10-CM | POA: Diagnosis not present

## 2021-03-26 NOTE — Patient Instructions (Addendum)
Cryotherapy Aftercare  Wash gently with soap and water everyday.   Apply Vaseline and Band-Aid daily until healed.    Wound Care Instructions  Cleanse wound gently with soap and water once a day then pat dry with clean gauze. Apply a thing coat of Petrolatum (petroleum jelly, "Vaseline") over the wound (unless you have an allergy to this). We recommend that you use a new, sterile tube of Vaseline. Do not pick or remove scabs. Do not remove the yellow or white "healing tissue" from the base of the wound.  Cover the wound with fresh, clean, nonstick gauze and secure with paper tape. You may use Band-Aids in place of gauze and tape if the would is small enough, but would recommend trimming much of the tape off as there is often too much. Sometimes Band-Aids can irritate the skin.  You should call the office for your biopsy report after 1 week if you have not already been contacted.  If you experience any problems, such as abnormal amounts of bleeding, swelling, significant bruising, significant pain, or evidence of infection, please call the office immediately.  FOR ADULT SURGERY PATIENTS: If you need something for pain relief you may take 1 extra strength Tylenol (acetaminophen) AND 2 Ibuprofen (200mg each) together every 4 hours as needed for pain. (do not take these if you are allergic to them or if you have a reason you should not take them.) Typically, you may only need pain medication for 1 to 3 days.     If you have any questions or concerns for your doctor, please call our main line at [phone] and press option 4 to reach your doctor's medical assistant. If no one answers, please leave a voicemail as directed and we will return your call as soon as possible. Messages left after 4 pm will be answered the following business day.   You may also send us a message via MyChart. We typically respond to MyChart messages within 1-2 business days.  For prescription refills, please ask your  pharmacy to contact our office. Our fax number is [phone].  If you have an urgent issue when the clinic is closed that cannot wait until the next business day, you can page your doctor at the number below.    Please note that while we do our best to be available for urgent issues outside of office hours, we are not available 24/7.   If you have an urgent issue and are unable to reach us, you may choose to seek medical care at your doctor's office, retail clinic, urgent care center, or emergency room.  If you have a medical emergency, please immediately call 911 or go to the emergency department.  Pager Numbers  - Dr. Kowalski: [phone]  - Dr. Moye: [phone]  - Dr. Stewart: [phone]  In the event of inclement weather, please call our main line at [phone] for an update on the status of any delays or closures.  Dermatology Medication Tips: Please keep the boxes that topical medications come in in order to help keep track of the instructions about where and how to use these. Pharmacies typically print the medication instructions only on the boxes and not directly on the medication tubes.   If your medication is too expensive, please contact our office at [phone] option 4 or send us a message through MyChart.   We are unable to tell what your co-pay for medications will be in advance as this is different depending on your insurance coverage.   However, we may be able to find a substitute medication at lower cost or fill out paperwork to get insurance to cover a needed medication.   If a prior authorization is required to get your medication covered by your insurance company, please allow us 1-2 business days to complete this process.  Drug prices often vary depending on where the prescription is filled and some pharmacies may offer cheaper prices.  The website www.goodrx.com contains coupons for medications through different pharmacies. The prices here do not  account for what the cost may be with help from insurance (it may be cheaper with your insurance), but the website can give you the price if you did not use any insurance.  - You can print the associated coupon and take it with your prescription to the pharmacy.  - You may also stop by our office during regular business hours and pick up a GoodRx coupon card.  - If you need your prescription sent electronically to a different pharmacy, notify our office through [facility] MyChart or by phone at [phone] option 4.  

## 2021-03-26 NOTE — Progress Notes (Signed)
Follow-Up Visit   Subjective  Kathy Byrd is a 55 y.o. female who presents for the following: Follow-up (Patient here today for ISK follow up. Some residual at right inframammary, otherwise clear. Patient also with symptomatic hemangioma at right antecubital that she would like removed. Patient advised she does have a spot at bottom of left foot that has been there for about 2 months and is sometimes sore. ).  The following portions of the chart were reviewed this encounter and updated as appropriate:   Tobacco  Allergies  Meds  Problems  Med Hx  Surg Hx  Fam Hx     Review of Systems:  No other skin or systemic complaints except as noted in HPI or Assessment and Plan.  Objective  Well appearing patient in no apparent distress; mood and affect are within normal limits.  A focused examination was performed including face, breast, left foot, arms. Relevant physical exam findings are noted in the Assessment and Plan.  Right Breast areola x 1, right medial cheek x 1 (2) Erythematous keratotic or waxy stuck-on papule or plaque.   Right Antecubital 1.3cm red papule  Left mid medial sole Verrucous papules -- Discussed viral etiology and contagion.    Assessment & Plan  Inflamed seborrheic keratosis Right Breast areola x 1, right medial cheek x 1  If spot at right breast not responding to LN2, consider biopsy.   Destruction of lesion - Right Breast areola x 1, right medial cheek x 1 Complexity: simple   Destruction method: cryotherapy   Informed consent: discussed and consent obtained   Timeout:  patient name, date of birth, surgical site, and procedure verified Lesion destroyed using liquid nitrogen: Yes   Region frozen until ice ball extended beyond lesion: Yes   Outcome: patient tolerated procedure well with no complications   Post-procedure details: wound care instructions given    Neoplasm of uncertain behavior of skin Right Antecubital  Epidermal / dermal  shaving  Lesion diameter (cm):  1.3 Informed consent: discussed and consent obtained   Timeout: patient name, date of birth, surgical site, and procedure verified   Procedure prep:  Patient was prepped and draped in usual sterile fashion Prep type:  Isopropyl alcohol Anesthesia: the lesion was anesthetized in a standard fashion   Local anesthetic: 0.75% bupivicaine. Instrument used: flexible razor blade   Hemostasis achieved with: pressure, aluminum chloride and electrodesiccation   Outcome: patient tolerated procedure well   Post-procedure details: sterile dressing applied and wound care instructions given   Dressing type: bandage and petrolatum    Specimen 1 - Surgical pathology Differential Diagnosis: Irritated traumatized hemangioma r/o Dysplasia  Check Margins: No 1.3cm red papule  Other viral warts Left mid medial sole  Discussed viral etiology and risk of spread.  Discussed multiple treatments may be required to clear warts.  Discussed possible post-treatment dyspigmentation and risk of recurrence.  Destruction of lesion - Left mid medial sole Complexity: simple   Destruction method: cryotherapy   Informed consent: discussed and consent obtained   Timeout:  patient name, date of birth, surgical site, and procedure verified Lesion destroyed using liquid nitrogen: Yes   Region frozen until ice ball extended beyond lesion: Yes   Outcome: patient tolerated procedure well with no complications   Post-procedure details: wound care instructions given    Actinic Damage - chronic, secondary to cumulative UV radiation exposure/sun exposure over time - diffuse scaly erythematous macules with underlying dyspigmentation - Recommend daily broad spectrum sunscreen SPF 30+  to sun-exposed areas, reapply every 2 hours as needed.  - Recommend staying in the shade or wearing long sleeves, sun glasses (UVA+UVB protection) and wide brim hats (4-inch brim around the entire circumference of  the hat). - Call for new or changing lesions.  Seborrheic Keratoses - Stuck-on, waxy, tan-brown papules and/or plaques  - Benign-appearing - Discussed benign etiology and prognosis. - Observe - Call for any changes  Return for 2-3 month ISK follow up.  Graciella Belton, RMA, am acting as scribe for Sarina Ser, MD . Documentation: I have reviewed the above documentation for accuracy and completeness, and I agree with the above.  Sarina Ser, MD

## 2021-03-28 ENCOUNTER — Encounter: Payer: Self-pay | Admitting: Dermatology

## 2021-04-02 ENCOUNTER — Telehealth: Payer: Self-pay

## 2021-04-02 NOTE — Telephone Encounter (Signed)
-----   Message from Ralene Bathe, MD sent at 03/31/2021  6:50 PM EDT ----- Diagnosis Skin , right antecubital LOBULAR HEMANGIOMA, IRRITATED  Benign irritated hemangioma No further treatment needed

## 2021-04-02 NOTE — Telephone Encounter (Signed)
Discussed biopsy results with pt  °

## 2021-06-13 ENCOUNTER — Ambulatory Visit: Payer: No Typology Code available for payment source | Admitting: Dermatology

## 2021-06-13 ENCOUNTER — Other Ambulatory Visit: Payer: Self-pay

## 2021-06-13 DIAGNOSIS — B079 Viral wart, unspecified: Secondary | ICD-10-CM

## 2021-06-13 DIAGNOSIS — L820 Inflamed seborrheic keratosis: Secondary | ICD-10-CM | POA: Diagnosis not present

## 2021-06-13 NOTE — Patient Instructions (Signed)

## 2021-06-13 NOTE — Progress Notes (Signed)
   Follow-Up Visit   Subjective  Kathy Byrd is a 55 y.o. female who presents for the following: ISK f/u (R breast areola, f/u LN2 x 2) and Warts (L mid medial sole, LN2 at last visit).  The following portions of the chart were reviewed this encounter and updated as appropriate:   Tobacco  Allergies  Meds  Problems  Med Hx  Surg Hx  Fam Hx     Review of Systems:  No other skin or systemic complaints except as noted in HPI or Assessment and Plan.  Objective  Well appearing patient in no apparent distress; mood and affect are within normal limits.  A focused examination was performed including right breast, left foot. Relevant physical exam findings are noted in the Assessment and Plan.  L mid medial sole Verrucous papules -- Discussed viral etiology and contagion.   R breast areola x 1, Total = 1 Residual erythematous keratotic or waxy stuck-on papule or plaque.       Assessment & Plan  Viral warts, unspecified type L mid medial sole  With possible foreign body  Discussed viral etiology and risk of spread.  Discussed multiple treatments may be required to clear warts.  Discussed possible post-treatment dyspigmentation and risk of recurrence.  Debridement today LN2 x 1 today  If persistent and not improving recommend punch excision  Destruction of lesion - L mid medial sole Complexity: simple   Destruction method: cryotherapy   Informed consent: discussed and consent obtained   Timeout:  patient name, date of birth, surgical site, and procedure verified Lesion destroyed using liquid nitrogen: Yes   Region frozen until ice ball extended beyond lesion: Yes   Outcome: patient tolerated procedure well with no complications   Post-procedure details: wound care instructions given    Inflamed seborrheic keratosis R breast areola x 1, Total = 1  Has improved with each LN2 treatment  Destruction of lesion - R breast areola x 1, Total = 1 Complexity: simple    Destruction method: cryotherapy   Informed consent: discussed and consent obtained   Timeout:  patient name, date of birth, surgical site, and procedure verified Lesion destroyed using liquid nitrogen: Yes   Region frozen until ice ball extended beyond lesion: Yes   Outcome: patient tolerated procedure well with no complications   Post-procedure details: wound care instructions given    Return in about 6 weeks (around 07/25/2021) for ISK f/u, Wart f/u.  I, Othelia Pulling, RMA, am acting as scribe for Sarina Ser, MD . Documentation: I have reviewed the above documentation for accuracy and completeness, and I agree with the above.  Sarina Ser, MD

## 2021-06-14 ENCOUNTER — Encounter: Payer: Self-pay | Admitting: Dermatology

## 2021-07-09 ENCOUNTER — Other Ambulatory Visit: Payer: Self-pay | Admitting: Primary Care

## 2021-07-09 DIAGNOSIS — Z1231 Encounter for screening mammogram for malignant neoplasm of breast: Secondary | ICD-10-CM

## 2021-07-19 ENCOUNTER — Ambulatory Visit (INDEPENDENT_AMBULATORY_CARE_PROVIDER_SITE_OTHER): Payer: No Typology Code available for payment source | Admitting: Primary Care

## 2021-07-19 ENCOUNTER — Encounter: Payer: Self-pay | Admitting: Primary Care

## 2021-07-19 ENCOUNTER — Other Ambulatory Visit: Payer: Self-pay

## 2021-07-19 VITALS — BP 128/66 | HR 60 | Temp 97.6°F | Ht 65.0 in | Wt 198.0 lb

## 2021-07-19 DIAGNOSIS — J302 Other seasonal allergic rhinitis: Secondary | ICD-10-CM | POA: Diagnosis not present

## 2021-07-19 DIAGNOSIS — E669 Obesity, unspecified: Secondary | ICD-10-CM | POA: Diagnosis not present

## 2021-07-19 DIAGNOSIS — Z0000 Encounter for general adult medical examination without abnormal findings: Secondary | ICD-10-CM

## 2021-07-19 LAB — CBC
HCT: 40.4 % (ref 36.0–46.0)
Hemoglobin: 13.6 g/dL (ref 12.0–15.0)
MCHC: 33.8 g/dL (ref 30.0–36.0)
MCV: 87.3 fl (ref 78.0–100.0)
Platelets: 233 10*3/uL (ref 150.0–400.0)
RBC: 4.62 Mil/uL (ref 3.87–5.11)
RDW: 12.7 % (ref 11.5–15.5)
WBC: 5 10*3/uL (ref 4.0–10.5)

## 2021-07-19 LAB — COMPREHENSIVE METABOLIC PANEL
ALT: 17 U/L (ref 0–35)
AST: 19 U/L (ref 0–37)
Albumin: 4.3 g/dL (ref 3.5–5.2)
Alkaline Phosphatase: 73 U/L (ref 39–117)
BUN: 15 mg/dL (ref 6–23)
CO2: 31 mEq/L (ref 19–32)
Calcium: 9.4 mg/dL (ref 8.4–10.5)
Chloride: 99 mEq/L (ref 96–112)
Creatinine, Ser: 0.83 mg/dL (ref 0.40–1.20)
GFR: 78.22 mL/min (ref >60.00)
Glucose, Bld: 86 mg/dL (ref 70–99)
Potassium: 4.3 mEq/L (ref 3.5–5.1)
Sodium: 134 mEq/L — ABNORMAL LOW (ref 135–145)
Total Bilirubin: 0.6 mg/dL (ref 0.2–1.2)
Total Protein: 7.3 g/dL (ref 6.0–8.3)

## 2021-07-19 LAB — TSH: TSH: 2.18 u[IU]/mL (ref 0.35–5.50)

## 2021-07-19 LAB — LIPID PANEL
Cholesterol: 219 mg/dL — ABNORMAL HIGH (ref 0–200)
HDL: 83.1 mg/dL (ref >39.00)
LDL Cholesterol: 122 mg/dL — ABNORMAL HIGH (ref 0–99)
NonHDL: 136.3
Total CHOL/HDL Ratio: 3
Triglycerides: 74 mg/dL (ref 0.0–149.0)
VLDL: 14.8 mg/dL (ref 0.0–40.0)

## 2021-07-19 LAB — VITAMIN D 25 HYDROXY (VIT D DEFICIENCY, FRACTURES): VITD: 26.91 ng/mL — ABNORMAL LOW (ref 30.00–100.00)

## 2021-07-19 LAB — HEMOGLOBIN A1C: Hgb A1c MFr Bld: 5.6 % (ref 4.6–6.5)

## 2021-07-19 NOTE — Patient Instructions (Signed)
Stop by the lab prior to leaving today. I will notify you of your results once received.   Try Meclizine as needed for vertigo.   Start exercising. You should be getting 150 minutes of moderate intensity exercise weekly.  Consider [facility] [place] for weight loss assistance.  It was a pleasure to see you today!  Preventive Care 92-57 Years Old, Female Preventive care refers to lifestyle choices and visits with your health care provider that can promote health and wellness. Preventive care visits are also called wellness exams. What can I expect for my preventive care visit? Counseling Your health care provider may ask you questions about your: Medical history, including: Past medical problems. Family medical history. Pregnancy history. Current health, including: Menstrual cycle. Method of birth control. Emotional well-being. Home life and relationship well-being. Sexual activity and sexual health. Lifestyle, including: Alcohol, nicotine or tobacco, and drug use. Access to firearms. Diet, exercise, and sleep habits. Work and work Statistician. Sunscreen use. Safety issues such as seatbelt and bike helmet use. Physical exam Your health care provider will check your: Height and weight. These may be used to calculate your BMI (body mass index). BMI is a measurement that tells if you are at a healthy weight. Waist circumference. This measures the distance around your waistline. This measurement also tells if you are at a healthy weight and may help predict your risk of certain diseases, such as type 2 diabetes and high blood pressure. Heart rate and blood pressure. Body temperature. Skin for abnormal spots. What immunizations do I need? Vaccines are usually given at various ages, according to a schedule. Your health care provider will recommend vaccines for you based on your age, medical history, and lifestyle or other factors, such as travel or where you  work. What tests do I need? Screening Your health care provider may recommend screening tests for certain conditions. This may include: Lipid and cholesterol levels. Diabetes screening. This is done by checking your blood sugar (glucose) after you have not eaten for a while (fasting). Pelvic exam and Pap test. Hepatitis B test. Hepatitis C test. HIV (human immunodeficiency virus) test. STI (sexually transmitted infection) testing, if you are at risk. Lung cancer screening. Colorectal cancer screening. Mammogram. Talk with your health care provider about when you should start having regular mammograms. This may depend on whether you have a family history of breast cancer. BRCA-related cancer screening. This may be done if you have a family history of breast, ovarian, tubal, or peritoneal cancers. Bone density scan. This is done to screen for osteoporosis. Talk with your health care provider about your test results, treatment options, and if necessary, the need for more tests. Follow these instructions at home: Eating and drinking  Eat a diet that includes fresh fruits and vegetables, whole grains, lean protein, and low-fat dairy products. Take vitamin and mineral supplements as recommended by your health care provider. Do not drink alcohol if: Your health care provider tells you not to drink. You are pregnant, may be pregnant, or are planning to become pregnant. If you drink alcohol: Limit how much you have to 0-1 drink a day. Know how much alcohol is in your drink. In the [place] one drink equals one 12 oz bottle of beer (355 mL), one 5 oz glass of wine (148 mL), or one 1 oz glass of hard liquor (44 mL). Lifestyle Brush your teeth every morning and night with fluoride toothpaste. Floss one time each day. Exercise for at least 30  minutes 5 or more days each week. Do not use any products that contain nicotine or tobacco. These products include cigarettes, chewing tobacco, and vaping  devices, such as e-cigarettes. If you need help quitting, ask your health care provider. Do not use drugs. If you are sexually active, practice safe sex. Use a condom or other form of protection to prevent STIs. If you do not wish to become pregnant, use a form of birth control. If you plan to become pregnant, see your health care provider for a prepregnancy visit. Take aspirin only as told by your health care provider. Make sure that you understand how much to take and what form to take. Work with your health care provider to find out whether it is safe and beneficial for you to take aspirin daily. Find healthy ways to manage stress, such as: Meditation, yoga, or listening to music. Journaling. Talking to a trusted person. Spending time with friends and family. Minimize exposure to UV radiation to reduce your risk of skin cancer. Safety Always wear your seat belt while driving or riding in a vehicle. Do not drive: If you have been drinking alcohol. Do not ride with someone who has been drinking. When you are tired or distracted. While texting. If you have been using any mind-altering substances or drugs. Wear a helmet and other protective equipment during sports activities. If you have firearms in your house, make sure you follow all gun safety procedures. Seek help if you have been physically or sexually abused. What's next? Visit your health care provider once a year for an annual wellness visit. Ask your health care provider how often you should have your eyes and teeth checked. Stay up to date on all vaccines. This information is not intended to replace advice given to you by your health care provider. Make sure you discuss any questions you have with your health care provider. Document Revised: 01/24/2021 Document Reviewed: 01/24/2021 Elsevier Patient Education  [place].

## 2021-07-19 NOTE — Assessment & Plan Note (Signed)
Immunizations UTD. Pap smear UTD. Mammogram due and is scheduled for tomorrow.  Colonoscopy UTD, due 2025.   Discussed the importance of a healthy diet and regular exercise in order for weight loss, and to reduce the risk of further co-morbidity.  Exam today stable. Labs pending.

## 2021-07-19 NOTE — Progress Notes (Signed)
Subjective:    Patient ID: Kathy Byrd, female    DOB: Jul 26, 1966, 55 y.o.   MRN: [id number]  HPI  Kathy Byrd is a very pleasant 55 y.o. female who presents today for complete physical and follow up of chronic conditions.  Immunizations: -Tetanus: 2019 -Influenza: Completed this season  -Covid-19: 3 vaccines  -Shingles: 2 vaccines   Diet: Fair diet.  Exercise: No regular exercise. Sometimes walks.   Eye exam: Completed in 2021 Dental exam: Completes semi-annually   Pap Smear: Completed in 2021 Mammogram: Completed in 2021, scheduled for tomorrow  Colonoscopy: Completed in 2020, due 2025  BP Readings from Last 3 Encounters:  07/19/21 128/66  03/01/20 136/84  04/08/19 91/68    Wt Readings from Last 3 Encounters:  07/19/21 198 lb (89.8 kg)  03/01/20 196 lb 4 oz (89 kg)  04/08/19 179 lb 14.3 oz (81.6 kg)      Review of Systems  Constitutional:  Negative for unexpected weight change.  HENT:  Negative for rhinorrhea.   Eyes:  Positive for visual disturbance.       Intermittent peripheral vision loss, chronic for 2 years.   Respiratory:  Negative for cough and shortness of breath.   Cardiovascular:  Negative for chest pain.  Gastrointestinal:  Negative for constipation and diarrhea.  Genitourinary:  Negative for difficulty urinating.  Musculoskeletal:  Negative for arthralgias and myalgias.  Skin:  Negative for rash.  Allergic/Immunologic: Positive for environmental allergies.  Neurological:  Negative for dizziness and headaches.  Psychiatric/Behavioral:  The patient is not nervous/anxious.         Past Medical History:  Diagnosis Date   Allergy    PONV (postoperative nausea and vomiting)    Seasonal allergies     Social History   Socioeconomic History   Marital status: Single    Spouse name: Not on file   Number of children: 0   Years of education: Not on file   Highest education level: Not on file  Occupational History    Employer: [employer]  Tobacco Use   Smoking status: Never   Smokeless tobacco: Never  Vaping Use   Vaping Use: Never used  Substance and Sexual Activity   Alcohol use: Yes    Alcohol/week: 0.0 standard drinks    Comment: rarely   Drug use: No   Sexual activity: Not on file  Other Topics Concern   Not on file  Social History Narrative   Not on file   Social Determinants of Health   Financial Resource Strain: Not on file  Food Insecurity: Not on file  Transportation Needs: Not on file  Physical Activity: Not on file  Stress: Not on file  Social Connections: Not on file  Intimate Partner Violence: Not on file    Past Surgical History:  Procedure Laterality Date   COLONOSCOPY     POLYPECTOMY     TOTAL ANKLE ARTHROPLASTY     WISDOM TOOTH EXTRACTION     without anesthesia    Family History  Problem Relation Age of Onset   Hyperlipidemia Father    Hypertension Father    Diabetes Maternal Grandmother    Depression Sister    Heart disease Sister        cardiovascular syndrome, genetic   Colon cancer Neg Hx    Breast cancer Neg Hx    Colon polyps Neg Hx    Esophageal cancer Neg Hx    Rectal cancer Neg Hx    Stomach  cancer Neg Hx     No Known Allergies  Current Outpatient Medications on File Prior to Visit  Medication Sig Dispense Refill   loratadine (CLARITIN) 10 MG tablet Take 10 mg by mouth daily.     Omega-3 Fatty Acids (FISH OIL) 1200 MG CAPS Take by mouth.     No current facility-administered medications on file prior to visit.    BP 128/66   Pulse 60   Temp 97.6 F (36.4 C) (Temporal)   Ht 5\' 5"  (1.651 m)   Wt 198 lb (89.8 kg)   SpO2 98%   BMI 32.95 kg/m  Objective:   Physical Exam HENT:     Right Ear: Tympanic membrane and ear canal normal.     Left Ear: Tympanic membrane and ear canal normal.     Nose: Nose normal.  Eyes:     Conjunctiva/sclera: Conjunctivae normal.     Pupils: Pupils are equal, round, and reactive to light.  Neck:     Thyroid: No  thyromegaly.  Cardiovascular:     Rate and Rhythm: Normal rate and regular rhythm.     Heart sounds: No murmur heard. Pulmonary:     Effort: Pulmonary effort is normal.     Breath sounds: Normal breath sounds. No rales.  Abdominal:     General: Bowel sounds are normal.     Palpations: Abdomen is soft.     Tenderness: There is no abdominal tenderness.  Musculoskeletal:        General: Normal range of motion.     Cervical back: Neck supple.  Lymphadenopathy:     Cervical: No cervical adenopathy.  Skin:    General: Skin is warm and dry.     Findings: No rash.  Neurological:     Mental Status: She is alert and oriented to person, place, and time.     Cranial Nerves: No cranial nerve deficit.     Deep Tendon Reflexes: Reflexes are normal and symmetric.  Psychiatric:        Mood and Affect: Mood normal.          Assessment & Plan:      This visit occurred during the SARS-CoV-2 public health emergency.  Safety protocols were in place, including screening questions prior to the visit, additional usage of staff PPE, and extensive cleaning of exam room while observing appropriate contact time as indicated for disinfecting solutions.

## 2021-07-19 NOTE — Assessment & Plan Note (Signed)
Chronic, weight gain of nearly 20 pounds since August 2020.  Strongly advised her to try a food journal, consider weight watchers, start exercising.   We also discussed evaluation at the [facility] in [place].  Checking labs.

## 2021-07-19 NOTE — Assessment & Plan Note (Signed)
Doing well on PRN claritin 10 mg, continue same.

## 2021-07-20 ENCOUNTER — Ambulatory Visit
Admission: RE | Admit: 2021-07-20 | Discharge: 2021-07-20 | Disposition: A | Discharge status: Home / Self Care | Payer: No Typology Code available for payment source | Source: Ambulatory Visit | Attending: Primary Care | Admitting: Primary Care

## 2021-07-20 DIAGNOSIS — Z1231 Encounter for screening mammogram for malignant neoplasm of breast: Secondary | ICD-10-CM | POA: Diagnosis present

## 2021-08-01 ENCOUNTER — Other Ambulatory Visit: Payer: Self-pay

## 2021-08-01 ENCOUNTER — Ambulatory Visit: Payer: No Typology Code available for payment source | Admitting: Dermatology

## 2021-08-01 DIAGNOSIS — H0260 Xanthelasma of unspecified eye, unspecified eyelid: Secondary | ICD-10-CM

## 2021-08-01 DIAGNOSIS — B079 Viral wart, unspecified: Secondary | ICD-10-CM | POA: Diagnosis not present

## 2021-08-01 DIAGNOSIS — L821 Other seborrheic keratosis: Secondary | ICD-10-CM

## 2021-08-01 NOTE — Patient Instructions (Signed)

## 2021-08-01 NOTE — Progress Notes (Signed)
° °  Follow-Up Visit   Subjective  Kathy Byrd is a 55 y.o. female who presents for the following: wart (L med sole - some improvement with LN2 but still persistent). The patient has spots, moles and lesions to be evaluated, some may be new or changing.  The following portions of the chart were reviewed this encounter and updated as appropriate:   Tobacco   Allergies   Meds   Problems   Med Hx   Surg Hx   Fam Hx      Review of Systems:  No other skin or systemic complaints except as noted in HPI or Assessment and Plan.  Objective  Well appearing patient in no apparent distress; mood and affect are within normal limits.  A focused examination was performed including the L foot. Relevant physical exam findings are noted in the Assessment and Plan.  L med heel x 1 Verrucous papules -- Discussed viral etiology and contagion.   Infra ocular Yellow patches.       Assessment & Plan  Viral warts, unspecified type L med heel x 1 Discussed viral etiology and risk of spread.  Discussed multiple treatments may be required to clear warts.  Discussed possible post-treatment dyspigmentation and risk of recurrence.  Destruction of lesion - L med heel x 1 Complexity: simple   Destruction method: cryotherapy   Informed consent: discussed and consent obtained   Timeout:  patient name, date of birth, surgical site, and procedure verified Lesion destroyed using liquid nitrogen: Yes   Region frozen until ice ball extended beyond lesion: Yes   Outcome: patient tolerated procedure well with no complications   Post-procedure details: wound care instructions given    Destruction of lesion - L med heel x 1 Complexity: simple   Destruction method: chemical removal   Informed consent: discussed and consent obtained   Chemical destruction method: cantharidin   Chemical destruction method comment:  Squaric acid 3% solution Application time:  4 hours Procedure instructions: patient instructed to  wash and dry area   Outcome: patient tolerated procedure well with no complications   Post-procedure details: wound care instructions given    Xanthelasma Infra ocular Deposits of cholesterol in the skin - benign, and difficult to treat. Patient states that her cholesterol is borderline high.  She should follow-up with her primary care physician about her cholesterol.  Advised it is unlikely related to her xanthelasma. Advised that there are not good simple treatment options. If she has a blepharoplasty in the future, she may have these excised with that procedure.  Seborrheic Keratoses - Stuck-on, waxy, tan-brown papules and/or plaques  - Benign-appearing - Discussed benign etiology and prognosis. - Observe - Call for any changes  Return if symptoms worsen or fail to improve, for wart f/u in 4-6 weeks.  Luther Redo, CMA, am acting as scribe for Sarina Ser, MD . Documentation: I have reviewed the above documentation for accuracy and completeness, and I agree with the above.  Sarina Ser, MD

## 2021-08-12 ENCOUNTER — Encounter: Payer: Self-pay | Admitting: Dermatology

## 2022-06-07 ENCOUNTER — Other Ambulatory Visit: Payer: Self-pay | Admitting: Primary Care

## 2022-06-07 DIAGNOSIS — Z1231 Encounter for screening mammogram for malignant neoplasm of breast: Secondary | ICD-10-CM

## 2022-07-23 ENCOUNTER — Ambulatory Visit
Admission: RE | Admit: 2022-07-23 | Discharge: 2022-07-23 | Disposition: A | Discharge status: Home / Self Care | Payer: No Typology Code available for payment source | Source: Ambulatory Visit | Attending: Primary Care | Admitting: Primary Care

## 2022-07-23 DIAGNOSIS — Z1231 Encounter for screening mammogram for malignant neoplasm of breast: Secondary | ICD-10-CM | POA: Insufficient documentation

## 2023-06-12 ENCOUNTER — Other Ambulatory Visit: Payer: Self-pay | Admitting: Primary Care

## 2023-06-12 DIAGNOSIS — Z1231 Encounter for screening mammogram for malignant neoplasm of breast: Secondary | ICD-10-CM

## 2023-07-29 ENCOUNTER — Ambulatory Visit
Admission: RE | Admit: 2023-07-29 | Discharge: 2023-07-29 | Disposition: A | Discharge status: Home / Self Care | Payer: No Typology Code available for payment source | Source: Ambulatory Visit | Attending: Primary Care | Admitting: Primary Care

## 2023-07-29 DIAGNOSIS — Z1231 Encounter for screening mammogram for malignant neoplasm of breast: Secondary | ICD-10-CM | POA: Diagnosis present

## 2023-08-11 ENCOUNTER — Ambulatory Visit (INDEPENDENT_AMBULATORY_CARE_PROVIDER_SITE_OTHER): Payer: No Typology Code available for payment source | Admitting: Primary Care

## 2023-08-11 ENCOUNTER — Encounter: Payer: Self-pay | Admitting: Primary Care

## 2023-08-11 VITALS — BP 130/82 | HR 55 | Temp 98.2°F | Ht 65.0 in | Wt 195.0 lb

## 2023-08-11 DIAGNOSIS — Z0000 Encounter for general adult medical examination without abnormal findings: Secondary | ICD-10-CM

## 2023-08-11 DIAGNOSIS — E785 Hyperlipidemia, unspecified: Secondary | ICD-10-CM | POA: Diagnosis not present

## 2023-08-11 DIAGNOSIS — J302 Other seasonal allergic rhinitis: Secondary | ICD-10-CM | POA: Diagnosis not present

## 2023-08-11 LAB — LIPID PANEL
Cholesterol: 221 mg/dL — ABNORMAL HIGH (ref 0–200)
HDL: 78.5 mg/dL (ref >39.00)
LDL Cholesterol: 120 mg/dL — ABNORMAL HIGH (ref 0–99)
NonHDL: 142.28
Total CHOL/HDL Ratio: 3
Triglycerides: 109 mg/dL (ref 0.0–149.0)
VLDL: 21.8 mg/dL (ref 0.0–40.0)

## 2023-08-11 LAB — COMPREHENSIVE METABOLIC PANEL
ALT: 18 U/L (ref 0–35)
AST: 18 U/L (ref 0–37)
Albumin: 4.5 g/dL (ref 3.5–5.2)
Alkaline Phosphatase: 87 U/L (ref 39–117)
BUN: 11 mg/dL (ref 6–23)
CO2: 31 meq/L (ref 19–32)
Calcium: 9.3 mg/dL (ref 8.4–10.5)
Chloride: 103 meq/L (ref 96–112)
Creatinine, Ser: 0.81 mg/dL (ref 0.40–1.20)
GFR: 79.39 mL/min (ref >60.00)
Glucose, Bld: 87 mg/dL (ref 70–99)
Potassium: 4.9 meq/L (ref 3.5–5.1)
Sodium: 141 meq/L (ref 135–145)
Total Bilirubin: 0.6 mg/dL (ref 0.2–1.2)
Total Protein: 7.1 g/dL (ref 6.0–8.3)

## 2023-08-11 LAB — CBC
HCT: 41.9 % (ref 36.0–46.0)
Hemoglobin: 13.9 g/dL (ref 12.0–15.0)
MCHC: 33.1 g/dL (ref 30.0–36.0)
MCV: 89.7 fL (ref 78.0–100.0)
Platelets: 244 10*3/uL (ref 150.0–400.0)
RBC: 4.67 Mil/uL (ref 3.87–5.11)
RDW: 13.1 % (ref 11.5–15.5)
WBC: 5.3 10*3/uL (ref 4.0–10.5)

## 2023-08-11 LAB — HEMOGLOBIN A1C: Hgb A1c MFr Bld: 5.7 % (ref 4.6–6.5)

## 2023-08-11 NOTE — Patient Instructions (Signed)
Stop by the lab prior to leaving today. I will notify you of your results once received.   Schedule your Pap smear when ready.  You are due for your colonoscopy in August 2025.  It was a pleasure to see you today!

## 2023-08-11 NOTE — Progress Notes (Signed)
Subjective:    Patient ID: Kathy Byrd, female    DOB: 03-15-1966, 57 y.o.   MRN: [id number]  HPI  Kathy Byrd is a very pleasant 58 y.o. female who presents today for complete physical and follow up of chronic conditions.  Immunizations: -Tetanus: Completed in 2019 -Influenza: Completed this season  -Shingles: Completed Shingrix series  Diet: Fair diet.  Exercise: No regular exercise.  Eye exam: Completed in 2024 Dental exam: Completes semi-annually   Pap Smear: July 2021  Mammogram: Completed in December 2024  Colonoscopy: Completed in 2020, due August 2025.  BP Readings from Last 3 Encounters:  08/11/23 130/82  07/19/21 128/66  03/01/20 136/84       Review of Systems  Constitutional:  Negative for unexpected weight change.  HENT:  Negative for rhinorrhea.   Respiratory:  Negative for cough and shortness of breath.   Cardiovascular:  Negative for chest pain.  Gastrointestinal:  Negative for constipation and diarrhea.  Genitourinary:  Negative for difficulty urinating.  Musculoskeletal:  Negative for arthralgias and myalgias.  Skin:  Negative for rash.  Allergic/Immunologic: Negative for environmental allergies.  Neurological:  Negative for dizziness, numbness and headaches.  Psychiatric/Behavioral:  The patient is not nervous/anxious.          Past Medical History:  Diagnosis Date   Allergy    PONV (postoperative nausea and vomiting)    Seasonal allergies     Social History   Socioeconomic History   Marital status: Single    Spouse name: Not on file   Number of children: 0   Years of education: Not on file   Highest education level: Bachelor's degree (e.g., BA, AB, BS)  Occupational History    Employer: Printmaker  Tobacco Use   Smoking status: Never   Smokeless tobacco: Never  Vaping Use   Vaping status: Never Used  Substance and Sexual Activity   Alcohol use: Yes    Alcohol/week: 0.0 standard drinks of alcohol    Comment:  rarely   Drug use: No   Sexual activity: Not on file  Other Topics Concern   Not on file  Social History Narrative   Not on file   Social Drivers of Health   Financial Resource Strain: Low Risk  (08/10/2023)   Overall Financial Resource Strain (CARDIA)    Difficulty of Paying Living Expenses: Not hard at all  Food Insecurity: No Food Insecurity (08/10/2023)   Hunger Vital Sign    Worried About Running Out of Food in the Last Year: Never true    Ran Out of Food in the Last Year: Never true  Transportation Needs: No Transportation Needs (08/10/2023)   PRAPARE - Administrator, Civil Service (Medical): No    Lack of Transportation (Non-Medical): No  Physical Activity: Insufficiently Active (08/10/2023)   Exercise Vital Sign    Days of Exercise per Week: 2 days    Minutes of Exercise per Session: 20 min  Stress: No Stress Concern Present (08/10/2023)   [employer] of Occupational Health - Occupational Stress Questionnaire    Feeling of Stress : Not at all  Social Connections: Moderately Integrated (08/10/2023)   Social Connection and Isolation Panel [NHANES]    Frequency of Communication with Friends and Family: More than three times a week    Frequency of Social Gatherings with Friends and Family: Twice a week    Attends Religious Services: More than 4 times per year    Active Member of [employer]  or Organizations: No    Attends Engineer, structural: Not on file    Marital Status: Living with partner  Intimate Partner Violence: Not on file    Past Surgical History:  Procedure Laterality Date   COLONOSCOPY     POLYPECTOMY     TOTAL ANKLE ARTHROPLASTY     WISDOM TOOTH EXTRACTION     without anesthesia    Family History  Problem Relation Age of Onset   Hyperlipidemia Father    Hypertension Father    Diabetes Maternal Grandmother    Depression Sister    Heart disease Sister        cardiovascular syndrome, genetic   Colon cancer Neg Hx    Breast  cancer Neg Hx    Colon polyps Neg Hx    Esophageal cancer Neg Hx    Rectal cancer Neg Hx    Stomach cancer Neg Hx     No Known Allergies  Current Outpatient Medications on File Prior to Visit  Medication Sig Dispense Refill   loratadine (CLARITIN) 10 MG tablet Take 10 mg by mouth daily.     Omega-3 Fatty Acids (FISH OIL) 1200 MG CAPS Take by mouth.     No current facility-administered medications on file prior to visit.    BP 130/82   Pulse (!) 55   Temp 98.2 F (36.8 C) (Temporal)   Ht 5\' 5"  (1.651 m)   Wt 195 lb (88.5 kg)   SpO2 98%   BMI 32.45 kg/m  Objective:   Physical Exam HENT:     Right Ear: Tympanic membrane and ear canal normal.     Left Ear: Tympanic membrane and ear canal normal.  Eyes:     Pupils: Pupils are equal, round, and reactive to light.  Cardiovascular:     Rate and Rhythm: Normal rate and regular rhythm.  Pulmonary:     Effort: Pulmonary effort is normal.     Breath sounds: Normal breath sounds.  Abdominal:     General: Bowel sounds are normal.     Palpations: Abdomen is soft.     Tenderness: There is no abdominal tenderness.  Musculoskeletal:        General: Normal range of motion.     Cervical back: Neck supple.  Skin:    General: Skin is warm and dry.  Neurological:     Mental Status: She is alert and oriented to person, place, and time.     Cranial Nerves: No cranial nerve deficit.     Deep Tendon Reflexes:     Reflex Scores:      Patellar reflexes are 2+ on the right side and 2+ on the left side. Psychiatric:        Mood and Affect: Mood normal.           Assessment & Plan:  Preventative health care Assessment & Plan: Immunizations UTD. Pap smear due, she declines today. Mammogram UTD. Colonoscopy UTD, due August 2025, discussed with patient today.  Discussed the importance of a healthy diet and regular exercise in order for weight loss, and to reduce the risk of further co-morbidity.  Exam stable. Labs  pending.  Follow up in 1 year for repeat physical.    Seasonal allergic rhinitis, unspecified trigger Assessment & Plan: Controlled. Continue Claritin 10 mg daily as needed.   Hyperlipidemia, unspecified hyperlipidemia type -     Hemoglobin A1c -     Comprehensive metabolic panel -     CBC -  Lipid panel        Doreene Nest, NP

## 2023-08-11 NOTE — Assessment & Plan Note (Signed)
Immunizations UTD. Pap smear due, she declines today. Mammogram UTD. Colonoscopy UTD, due August 2025, discussed with patient today.  Discussed the importance of a healthy diet and regular exercise in order for weight loss, and to reduce the risk of further co-morbidity.  Exam stable. Labs pending.  Follow up in 1 year for repeat physical.

## 2023-08-11 NOTE — Assessment & Plan Note (Signed)
Controlled. Continue Claritin 10 mg daily as needed.

## 2023-11-03 ENCOUNTER — Ambulatory Visit

## 2024-01-01 ENCOUNTER — Other Ambulatory Visit: Payer: Self-pay | Admitting: Orthopedic Surgery

## 2024-01-01 DIAGNOSIS — M7541 Impingement syndrome of right shoulder: Secondary | ICD-10-CM

## 2024-01-19 DIAGNOSIS — R7303 Prediabetes: Secondary | ICD-10-CM

## 2024-01-19 DIAGNOSIS — E785 Hyperlipidemia, unspecified: Secondary | ICD-10-CM

## 2024-01-19 DIAGNOSIS — Z01818 Encounter for other preprocedural examination: Secondary | ICD-10-CM

## 2024-01-23 ENCOUNTER — Other Ambulatory Visit (INDEPENDENT_AMBULATORY_CARE_PROVIDER_SITE_OTHER)

## 2024-01-23 DIAGNOSIS — R7303 Prediabetes: Secondary | ICD-10-CM

## 2024-01-23 DIAGNOSIS — Z01818 Encounter for other preprocedural examination: Secondary | ICD-10-CM

## 2024-01-23 DIAGNOSIS — E785 Hyperlipidemia, unspecified: Secondary | ICD-10-CM

## 2024-01-23 NOTE — Addendum Note (Signed)
 Addended by: Darcella Earnest on: 01/23/2024 08:11 AM   Modules accepted: Orders

## 2024-01-24 LAB — LIPID PANEL
Cholesterol: 186 mg/dL (ref <200)
HDL: 76 mg/dL (ref >=50)
LDL Cholesterol (Calc): 94 mg/dL
Non-HDL Cholesterol (Calc): 110 mg/dL (ref <130)
Total CHOL/HDL Ratio: 2.4 (calc) (ref <5.0)
Triglycerides: 72 mg/dL (ref <150)

## 2024-01-24 LAB — CBC
HCT: 42.1 % (ref 35.0–45.0)
Hemoglobin: 13.6 g/dL (ref 11.7–15.5)
MCH: 29.1 pg (ref 27.0–33.0)
MCHC: 32.3 g/dL (ref 32.0–36.0)
MCV: 90.1 fL (ref 80.0–100.0)
MPV: 10.2 fL (ref 7.5–12.5)
Platelets: 231 10*3/uL (ref 140–400)
RBC: 4.67 10*6/uL (ref 3.80–5.10)
RDW: 12.5 % (ref 11.0–15.0)
WBC: 5 10*3/uL (ref 3.8–10.8)

## 2024-01-24 LAB — COMPLETE METABOLIC PANEL WITHOUT GFR
AG Ratio: 2 (calc) (ref 1.0–2.5)
ALT: 18 U/L (ref 6–29)
AST: 19 U/L (ref 10–35)
Albumin: 4.5 g/dL (ref 3.6–5.1)
Alkaline phosphatase (APISO): 78 U/L (ref 37–153)
BUN: 17 mg/dL (ref 7–25)
CO2: 27 mmol/L (ref 20–32)
Calcium: 9.4 mg/dL (ref 8.6–10.4)
Chloride: 104 mmol/L (ref 98–110)
Creat: 0.82 mg/dL (ref 0.50–1.05)
Globulin: 2.2 g/dL (ref 1.9–3.7)
Glucose, Bld: 84 mg/dL (ref 65–99)
Potassium: 5.1 mmol/L (ref 3.5–5.3)
Sodium: 140 mmol/L (ref 135–146)
Total Bilirubin: 0.5 mg/dL (ref 0.2–1.2)
Total Protein: 6.7 g/dL (ref 6.1–8.1)

## 2024-01-24 LAB — HEMOGLOBIN A1C
Hgb A1c MFr Bld: 5.5 % (ref <5.7)
Mean Plasma Glucose: 111 mg/dL
eAG (mmol/L): 6.2 mmol/L

## 2024-01-25 ENCOUNTER — Ambulatory Visit: Payer: Self-pay | Admitting: Primary Care

## 2024-02-24 ENCOUNTER — Other Ambulatory Visit: Payer: Self-pay | Admitting: Primary Care

## 2024-02-24 DIAGNOSIS — Z1231 Encounter for screening mammogram for malignant neoplasm of breast: Secondary | ICD-10-CM

## 2024-02-27 ENCOUNTER — Encounter: Payer: Self-pay | Admitting: Internal Medicine

## 2024-03-04 ENCOUNTER — Ambulatory Visit: Payer: Self-pay

## 2024-03-04 ENCOUNTER — Ambulatory Visit

## 2024-03-04 NOTE — Telephone Encounter (Signed)
 This nurse looked back and saw that pt's appt, the earliest available, was made for tomorrow 7/25 instead of 7/24. Called pt to ensure pt is seen today. Pt reporting that she had just noticed as well and was on phone with receptionist, pt let receptionist go on other line. Nurse apologized for the mix-up and confirmed nurse advice that pt be examined today within next few hours, pt is in agreement, advised pt go to [facility], advised could keep tomorrow's appt with PCP in case of different recommendations. Pt stating she will head to [facility] and keep her appt with PCP for tomorrow. No further questions or concerns at this time.

## 2024-03-04 NOTE — Telephone Encounter (Signed)
 Noted, will evaluate.

## 2024-03-04 NOTE — Telephone Encounter (Signed)
 FYI Only or Action Required?: FYI only for provider.  Patient was last seen in primary care on 08/11/2023 by Gretta Comer POUR, NP.  Called Nurse Triage reporting Urticaria and Arm Swelling.  Symptoms began several days ago.  Interventions attempted: OTC medications: benadryl, Prescription medications: statin powder, statin cream, diflucan, 1% steroid cream, and Rest, hydration, or home remedies.  Symptoms are: gradually worsening.  Triage Disposition: See Physician Within 24 Hours  Patient/caregiver understands and will follow disposition?: Yes      Copied from CRM #[id number]. Topic: Clinical - Red Word Triage >> Mar 04, 2024 11:26 AM Chasity T wrote: [facility] that prompted transfer to Nurse Triage: Allergic reaction on right arm down to your hand, no swelling just hives  Reason for Disposition  [1] MODERATE-SEVERE hives (e.g.,hives interfere with normal activities or work) AND [2] not improved after taking antihistamine (e.g., cetirizine, fexofenadine , or loratadine) > 24 hours  Answer Assessment - Initial Assessment Questions 1. APPEARANCE: What does the rash look like?      Real bumpy Had rotator cuff repair surgery on 7/16, gave long-acting injection for pain, had reaction to tape used, put medicine on that then got better, got residue off from tape, noticed hives on 7/24, used statin powder, told surgeon, got cream, went there yesterday gave diflucan and 1% steroid cream to arm and shoulder, shoulder all the way down to hand, taking benadryl the past 2 days, confirms no implant with surgery Pretty aggressive hives If not getting better today call PCP All the way around arm front and back 4. SIZE: How big are the hives? (e.g., inches, cm, compare to coins) Do they all look the same or do they vary in shape and size?      All different shapes and sizes, in crease of elbow A lot of them are raised and red majority 5. ONSET: When did the hives begin? (e.g., hours or days  ago)      Noticed Saturday, could have been before then but had injection that blocked a lot of feeling 6. ITCHING: Does it itch? If Yes, ask: How bad is the itch?  (e.g., none, mild, moderate, severe)     Itches, moderate, wakes me up at night sometimes 8. TRIGGERS: Were you exposed to any new food, plant, cosmetic product or animal just before the hives began?     No, all on one arm, continuously getting worse 9. OTHER SYMPTOMS: Do you have any other symptoms? (e.g., fever, tongue swelling, difficulty breathing, abdomen pain)     No fever or swelling to tongue or throat, SOB or chest pain, red streaking, abdominal pain, or hoarseness or cough Hard to tell if arm swelling since just had surgery, a little swollen around crease of elbow definitely swollen Some of them kind of getting together and making bigger splotches My shoulder is fine like with surgery, just this arm with the hives giving trouble   Advised pt be examined shortly today, scheduled with PCP within 4 hours. Advised head to ED if fever, SOB, chest pain, or condition rapidly worsening.  Protocols used: Hives-A-AH

## 2024-03-05 ENCOUNTER — Ambulatory Visit: Admitting: Primary Care

## 2024-05-03 ENCOUNTER — Encounter

## 2024-05-13 ENCOUNTER — Encounter: Payer: Self-pay | Admitting: Internal Medicine

## 2024-05-13 ENCOUNTER — Ambulatory Visit (AMBULATORY_SURGERY_CENTER): Admitting: *Deleted

## 2024-05-13 VITALS — Ht 65.0 in | Wt 185.0 lb

## 2024-05-13 DIAGNOSIS — Z860100 Personal history of colon polyps, unspecified: Secondary | ICD-10-CM

## 2024-05-13 MED ORDER — NA SULFATE-K SULFATE-MG SULF 17.5-3.13-1.6 GM/177ML PO SOLN: 1.0000 | Freq: Once | ORAL | 0 refills | Status: AC

## 2024-05-13 NOTE — Progress Notes (Signed)
 Pt's name and DOB verified at the beginning of the pre-visit with 2 identifiers   Pt denies any difficulty with ambulating,sitting, laying down or rolling side to side  Pt has no issues moving head neck or swallowing  No egg or soy allergy known to patient   No issues known to pt with past sedation  No FH of Malignant Hyperthermia  Pt is not on home 02   Pt is not on blood thinners   Pt denies issues with constipation   Pt has frequent issues with constipation RN instructed pt to use Miralax per bottles instructions a week before prep days. Pt states they will  Pt is not on dialysis  Pt denise any abnormal heart rhythms   Pt denies any upcoming cardiac testing  Patient's chart reviewed by Norleen Schillings CNRA prior to pre-visit and patient appropriate for the [facility].  Pre-visit completed and red dot placed by patient's name on their procedure day (on provider's schedule).    Visit by phone  Pt states weight is   Pt given  both [facility] main # and MD on call # prior to instructions.  Informed pt to come in at the time discussed and is shown on PV instructions.  Pt instructed to use Singlecare.com or GoodRx for a price reduction on prep  Instructed pt where to find PV instructions in My Ch. Copy of instructions  to be sent in mail and address read back to pt to verify correct on envelope. Instructed pt on all aspects of written instructions including med holds clothing to wear and foods to eat and not eat as well as after procedure legal restrictions and to call MD on call if needed.. Pt states understanding. Instructed pt to review instructions again prior to procedure and call main # given if has any questions or any issues. Pt states they will.

## 2024-05-24 ENCOUNTER — Encounter: Payer: Self-pay | Admitting: Internal Medicine

## 2024-05-24 ENCOUNTER — Ambulatory Visit: Admitting: Internal Medicine

## 2024-05-24 VITALS — BP 114/54 | HR 50 | Temp 97.4°F | Resp 13 | Ht 65.0 in | Wt 185.0 lb

## 2024-05-24 DIAGNOSIS — Z1211 Encounter for screening for malignant neoplasm of colon: Secondary | ICD-10-CM | POA: Diagnosis present

## 2024-05-24 DIAGNOSIS — Z860100 Personal history of colon polyps, unspecified: Secondary | ICD-10-CM

## 2024-05-24 DIAGNOSIS — Z860101 Personal history of adenomatous and serrated colon polyps: Secondary | ICD-10-CM | POA: Diagnosis not present

## 2024-05-24 MED ORDER — SODIUM CHLORIDE 0.9 % IV SOLN: 500.0000 mL | Freq: Once | INTRAVENOUS | Status: DC

## 2024-05-24 NOTE — Progress Notes (Signed)
 GASTROENTEROLOGY PROCEDURE H&P NOTE   Primary Care Physician: Gretta Comer POUR, NP    Reason for Procedure:  History of colonic polyps  Plan:    Colonoscopy  Patient is appropriate for endoscopic procedure(s) in the ambulatory ([facility]) setting.  The nature of the procedure, as well as the risks, benefits, and alternatives were carefully and thoroughly reviewed with the patient. Ample time for discussion and questions allowed. The patient understood, was satisfied, and agreed to proceed.     HPI: Kathy Byrd is a 58 y.o. female who presents for surveillance colonoscopy.  Medical history as below.  Tolerated the prep.  No recent chest pain or shortness of breath.  No abdominal pain today.  Past Medical History:  Diagnosis Date   Allergy    PONV (postoperative nausea and vomiting)    Seasonal allergies     Past Surgical History:  Procedure Laterality Date   COLONOSCOPY     POLYPECTOMY     TOTAL ANKLE ARTHROPLASTY     WISDOM TOOTH EXTRACTION     without anesthesia    Prior to Admission medications   Medication Sig Start Date End Date Taking? Authorizing Provider  loratadine (CLARITIN) 10 MG tablet Take 10 mg by mouth daily. Patient taking differently: Take 10 mg by mouth as needed.   Yes [provider]  meloxicam (MOBIC) 7.5 MG tablet Take 7.5 mg by mouth daily.   Yes [provider]  Omega-3 Fatty Acids (FISH OIL) 1200 MG CAPS Take by mouth. Patient not taking: Reported on 05/24/2024    [provider]    Current Outpatient Medications  Medication Sig Dispense Refill   loratadine (CLARITIN) 10 MG tablet Take 10 mg by mouth daily. (Patient taking differently: Take 10 mg by mouth as needed.)     meloxicam (MOBIC) 7.5 MG tablet Take 7.5 mg by mouth daily.     Omega-3 Fatty Acids (FISH OIL) 1200 MG CAPS Take by mouth. (Patient not taking: Reported on 05/24/2024)     Current Facility-Administered Medications  Medication Dose Route  Frequency Provider Last Rate Last Admin   0.9 %  sodium chloride  infusion  500 mL Intravenous Once Greydon Betke, Gordy HERO, MD        Allergies as of 05/24/2024 - Review Complete 05/24/2024  Allergen Reaction Noted   Bupivacaine liposome Rash 05/24/2024   Tape Rash 05/24/2024    Family History  Problem Relation Age of Onset   Hyperlipidemia Father    Hypertension Father    Diabetes Maternal Grandmother    Depression Sister    Heart disease Sister        cardiovascular syndrome, genetic   Colon cancer Neg Hx    Breast cancer Neg Hx    Colon polyps Neg Hx    Esophageal cancer Neg Hx    Rectal cancer Neg Hx    Stomach cancer Neg Hx     Social History   Socioeconomic History   Marital status: Single    Spouse name: Not on file   Number of children: 0   Years of education: Not on file   Highest education level: Bachelor's degree (e.g., BA, AB, BS)  Occupational History    Employer: Printmaker  Tobacco Use   Smoking status: Never   Smokeless tobacco: Never  Vaping Use   Vaping status: Never Used  Substance and Sexual Activity   Alcohol use: Yes    Alcohol/week: 0.0 standard drinks of alcohol    Comment: rarely  Drug use: No   Sexual activity: Not on file  Other Topics Concern   Not on file  Social History Narrative   Not on file   Social Drivers of Health   Financial Resource Strain: Low Risk  (03/04/2024)   Overall Financial Resource Strain (CARDIA)    Difficulty of Paying Living Expenses: Not hard at all  Food Insecurity: No Food Insecurity (03/04/2024)   Hunger Vital Sign    Worried About Running Out of Food in the Last Year: Never true    Ran Out of Food in the Last Year: Never true  Transportation Needs: No Transportation Needs (03/04/2024)   PRAPARE - Administrator, Civil Service (Medical): No    Lack of Transportation (Non-Medical): No  Physical Activity: Insufficiently Active (03/04/2024)   Exercise Vital Sign    Days of Exercise per Week: 2  days    Minutes of Exercise per Session: 20 min  Stress: No Stress Concern Present (03/04/2024)   [employer] of Occupational Health - Occupational Stress Questionnaire    Feeling of Stress: Not at all  Social Connections: Moderately Isolated (03/04/2024)   Social Connection and Isolation Panel    Frequency of Communication with Friends and Family: More than three times a week    Frequency of Social Gatherings with Friends and Family: Once a week    Attends Religious Services: Never    Database administrator or Organizations: No    Attends Engineer, structural: Not on file    Marital Status: Living with partner  Intimate Partner Violence: Not on file    Physical Exam: Vital signs in last 24 hours: @BP  (!) 144/81   Pulse 64   Temp (!) 97.4 F (36.3 C)   Ht 5' 5 (1.651 m)   Wt 185 lb (83.9 kg)   SpO2 98%   BMI 30.79 kg/m  GEN: NAD EYE: Sclerae anicteric ENT: MMM CV: Non-tachycardic Pulm: CTA b/l GI: Soft, NT/ND NEURO:  Alert & Oriented x 3   Gordy Starch, MD [facility]  05/24/2024 9:06 AM

## 2024-05-24 NOTE — Patient Instructions (Signed)
 Resume previous diet  Continue present medications  Repeat colonoscopy in 7 years for surveillance  YOU HAD AN ENDOSCOPIC PROCEDURE TODAY AT THE [facility]:   Refer to the procedure report that was given to you for any specific questions about what was found during the examination.  If the procedure report does not answer your questions, please call your gastroenterologist to clarify.  If you requested that your care partner not be given the details of your procedure findings, then the procedure report has been included in a sealed envelope for you to review at your convenience later.  YOU SHOULD EXPECT: Some feelings of bloating in the abdomen. Passage of more gas than usual.  Walking can help get rid of the air that was put into your GI tract during the procedure and reduce the bloating. If you had a lower endoscopy (such as a colonoscopy or flexible sigmoidoscopy) you may notice spotting of blood in your stool or on the toilet paper. If you underwent a bowel prep for your procedure, you may not have a normal bowel movement for a few days.  Please Note:  You might notice some irritation and congestion in your nose or some drainage.  This is from the oxygen used during your procedure.  There is no need for concern and it should clear up in a day or so.  SYMPTOMS TO REPORT IMMEDIATELY: Following lower endoscopy (colonoscopy or flexible sigmoidoscopy):  Excessive amounts of blood in the stool  Significant tenderness or worsening of abdominal pains  Swelling of the abdomen that is new, acute  Fever of 100F or higher  For urgent or emergent issues, a gastroenterologist can be reached at any hour by calling [phone]. Do not use MyChart messaging for urgent concerns.   DIET:  We do recommend a small meal at first, but then you may proceed to your regular diet.  Drink plenty of fluids but you should avoid alcoholic beverages for 24 hours.  ACTIVITY:  You should plan to take it  easy for the rest of today and you should NOT DRIVE or use heavy machinery until tomorrow (because of the sedation medicines used during the test).    FOLLOW UP: Our staff will call the number listed on your records the next business day following your procedure.  We will call around 7:15- 8:00 am to check on you and address any questions or concerns that you may have regarding the information given to you following your procedure. If we do not reach you, we will leave a message.     If any biopsies were taken you will be contacted by phone or by letter within the next 1-3 weeks.  Please call us  at [phone] if you have not heard about the biopsies in 3 weeks.   SIGNATURES/CONFIDENTIALITY: You and/or your care partner have signed paperwork which will be entered into your electronic medical record.  These signatures attest to the fact that that the information above on your After Visit Summary has been reviewed and is understood.  Full responsibility of the confidentiality of this discharge information lies with you and/or your care-partner.

## 2024-05-24 NOTE — Progress Notes (Signed)
 Report to PACU, RN, vss, BBS= Clear.

## 2024-05-24 NOTE — Op Note (Signed)
 [facility] Patient Name: Kathy Byrd Procedure Date: 05/24/2024 9:09 AM MRN: [id number] Endoscopist: Gordy CHRISTELLA Starch , MD, [id number] Age: 58 Referring MD:  Date of Birth: September 24, 1965 Gender: Female Account #: [id number] Procedure:                Colonoscopy Indications:              High risk colon cancer surveillance: Personal                            history of multiple adenomas, Last colonoscopy:                            August 2020 (no polyps), July 2017 (index exam, TA                            x 3) Medicines:                Monitored Anesthesia Care Procedure:                Pre-Anesthesia Assessment:                           - Prior to the procedure, a History and Physical                            was performed, and patient medications and                            allergies were reviewed. The patient's tolerance of                            previous anesthesia was also reviewed. The risks                            and benefits of the procedure and the sedation                            options and risks were discussed with the patient.                            All questions were answered, and informed consent                            was obtained. Prior Anticoagulants: The patient has                            taken no anticoagulant or antiplatelet agents. ASA                            Grade Assessment: II - A patient with mild systemic                            disease. After reviewing the risks and benefits,  the patient was deemed in satisfactory condition to                            undergo the procedure.                           After obtaining informed consent, the colonoscope                            was passed under direct vision. Throughout the                            procedure, the patient's blood pressure, pulse, and                            oxygen saturations were monitored continuously. The                             CF [id number] #[id number] was introduced through the anus                            and advanced to the cecum, identified by                            appendiceal orifice and ileocecal valve. The                            colonoscopy was performed without difficulty. The                            patient tolerated the procedure well. The quality                            of the bowel preparation was excellent. The                            ileocecal valve, appendiceal orifice, and rectum                            were photographed. Scope In: 9:16:36 AM Scope Out: 9:28:56 AM Scope Withdrawal Time: 0 hours 8 minutes 22 seconds  Total Procedure Duration: 0 hours 12 minutes 20 seconds  Findings:                 The digital rectal exam was normal.                           The entire examined colon appeared normal on direct                            and retroflexion views. Complications:            No immediate complications. Estimated Blood Loss:     Estimated blood loss: none. Impression:               - The entire examined colon is normal  on direct and                            retroflexion views.                           - No specimens collected. Recommendation:           - Patient has a contact number available for                            emergencies. The signs and symptoms of potential                            delayed complications were discussed with the                            patient. Return to normal activities tomorrow.                            Written discharge instructions were provided to the                            patient.                           - Resume previous diet.                           - Continue present medications.                           - Repeat colonoscopy in 7 years for surveillance. Gordy CHRISTELLA Starch, MD 05/24/2024 9:31:38 AM This report has been signed electronically.

## 2024-05-24 NOTE — Progress Notes (Signed)
 VS by CL  Pt's states no medical or surgical changes since previsit or office visit.

## 2024-05-25 ENCOUNTER — Telehealth: Payer: Self-pay

## 2024-05-25 NOTE — Telephone Encounter (Signed)
 Follow up call to pt, lm for pt to call if having any difficulty with normal activities or eating and drinking.  Also to call if any other questions or concerns.

## 2024-07-30 ENCOUNTER — Encounter

## 2024-08-02 ENCOUNTER — Ambulatory Visit
Admission: RE | Admit: 2024-08-02 | Discharge: 2024-08-02 | Disposition: A | Discharge status: Home / Self Care | Source: Ambulatory Visit | Attending: Primary Care | Admitting: Primary Care

## 2024-08-02 DIAGNOSIS — Z1231 Encounter for screening mammogram for malignant neoplasm of breast: Secondary | ICD-10-CM | POA: Diagnosis present

## 2024-08-06 ENCOUNTER — Ambulatory Visit: Payer: Self-pay | Admitting: Primary Care
